# Patient Record
Sex: Female | Born: 2000 | Race: Black or African American | Hispanic: No | Marital: Single | Smoking: Never smoker
Health system: Southern US, Community
[De-identification: ages and names within clinical notes are randomized; demographics above are authoritative.]

## PROBLEM LIST (undated history)

## (undated) DIAGNOSIS — Z8619 Personal history of other infectious and parasitic diseases: Secondary | ICD-10-CM

## (undated) DIAGNOSIS — Z789 Other specified health status: Secondary | ICD-10-CM

## (undated) HISTORY — DX: Other specified health status: Z78.9

## (undated) HISTORY — DX: Personal history of other infectious and parasitic diseases: Z86.19

## (undated) HISTORY — PX: HERNIA REPAIR: SHX51

---
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
## 2018-01-17 ENCOUNTER — Emergency Department (HOSPITAL_COMMUNITY)
Admission: EM | Admit: 2018-01-17 | Discharge: 2018-01-17 | Disposition: A | Discharge status: Home / Self Care | Payer: Self-pay | Attending: Emergency Medicine | Admitting: Emergency Medicine

## 2018-01-17 ENCOUNTER — Other Ambulatory Visit: Payer: Self-pay

## 2018-01-17 ENCOUNTER — Emergency Department (HOSPITAL_COMMUNITY): Payer: Self-pay

## 2018-01-17 ENCOUNTER — Encounter (HOSPITAL_COMMUNITY): Payer: Self-pay

## 2018-01-17 DIAGNOSIS — R0789 Other chest pain: Secondary | ICD-10-CM | POA: Insufficient documentation

## 2018-01-17 LAB — BASIC METABOLIC PANEL
ANION GAP: 9 (ref 5–15)
BUN: 9 mg/dL (ref 6–20)
CHLORIDE: 106 mmol/L (ref 98–111)
CO2: 23 mmol/L (ref 22–32)
Calcium: 8.9 mg/dL (ref 8.9–10.3)
Creatinine, Ser: 0.79 mg/dL (ref 0.44–1.00)
GFR calc non Af Amer: >60 mL/min (ref >60)
GLUCOSE: 94 mg/dL (ref 70–99)
Potassium: 3.4 mmol/L — ABNORMAL LOW (ref 3.5–5.1)
Sodium: 138 mmol/L (ref 135–145)

## 2018-01-17 LAB — I-STAT TROPONIN, ED: Troponin i, poc: 0 ng/mL (ref 0.00–0.08)

## 2018-01-17 LAB — HEPATIC FUNCTION PANEL
ALK PHOS: 42 U/L (ref 38–126)
ALT: 11 U/L (ref 0–44)
AST: 15 U/L (ref 15–41)
Albumin: 3.7 g/dL (ref 3.5–5.0)
Bilirubin, Direct: <0.1 mg/dL (ref 0.0–0.2)
TOTAL PROTEIN: 7.2 g/dL (ref 6.5–8.1)
Total Bilirubin: 1 mg/dL (ref 0.3–1.2)

## 2018-01-17 LAB — CBC
HCT: 36.3 % (ref 36.0–46.0)
HEMOGLOBIN: 12.1 g/dL (ref 12.0–15.0)
MCH: 26.9 pg (ref 26.0–34.0)
MCHC: 33.3 g/dL (ref 30.0–36.0)
MCV: 80.7 fL (ref 78.0–100.0)
Platelets: 430 10*3/uL — ABNORMAL HIGH (ref 150–400)
RBC: 4.5 MIL/uL (ref 3.87–5.11)
RDW: 13 % (ref 11.5–15.5)
WBC: 7.6 10*3/uL (ref 4.0–10.5)

## 2018-01-17 LAB — I-STAT BETA HCG BLOOD, ED (MC, WL, AP ONLY): I-stat hCG, quantitative: <5 m[IU]/mL (ref <5)

## 2018-01-17 LAB — LIPASE, BLOOD: Lipase: 34 U/L (ref 11–51)

## 2018-01-17 MED ORDER — KETOROLAC TROMETHAMINE 30 MG/ML IJ SOLN
30.0000 mg | Freq: Once | INTRAMUSCULAR | Status: AC
  Administered 2018-01-17: 30 mg via INTRAMUSCULAR
  Filled 2018-01-17: qty 1

## 2018-01-17 MED ORDER — NAPROXEN 500 MG PO TABS: 500.0000 mg | ORAL_TABLET | Freq: Two times a day (BID) | ORAL | 0 refills | Status: DC

## 2018-01-17 MED ORDER — BENZONATATE 100 MG PO CAPS
200.0000 mg | ORAL_CAPSULE | Freq: Once | ORAL | Status: AC
  Administered 2018-01-17: 200 mg via ORAL
  Filled 2018-01-17: qty 2

## 2018-01-17 MED ORDER — BENZONATATE 100 MG PO CAPS: 200.0000 mg | ORAL_CAPSULE | Freq: Three times a day (TID) | ORAL | 0 refills | Status: DC

## 2018-01-17 NOTE — ED Provider Notes (Signed)
Patient placed in Quick Look pathway, seen and evaluated   Chief Complaint: Chest tightness productive cough  HPI:   Patient reports that she has had chest pain "for a while" and tightness.  She reports consistent cough for a month.  Feels like her throat has been closing for a month.    ROS: No fevers  Physical Exam:   Gen: No distress  Neuro: Awake and Alert  Skin: Warm    Focused Exam: Lungs CTAB.    Initiation of care has begun. The patient has been counseled on the process, plan, and necessity for staying for the completion/evaluation, and the remainder of the medical screening examination ]   Cristina GongHammond, Latonja Bobeck W, PA-C 01/17/18 1806    Charlynne PanderYao, David Hsienta, MD 01/17/18 [phone]

## 2018-01-17 NOTE — ED Provider Notes (Signed)
[facility] EMERGENCY DEPARTMENT Provider Note   CSN: [id number] Arrival date & time: 01/17/18  1746     History   Chief Complaint Chief Complaint  Patient presents with  . Chest Pain    HPI Dominique Travis is a 17 y.o. female who presents to ED for evaluation of chest pain that began last night. Also reports cough for the past month. Denies hemoptysis. States she spits/coughs up "mucus" every morning after waking up. No improvement with a few cough drops. Also reports some abdominal pain described as "just a tummyache, or maybe it's because I'm hungry." She takes ibuprofen for headaches but has not taken any particularly for the chest pain today. Has not tried any other medications to help with symptoms. Denies SOB, vomiting, bowel changes, fever, sick contacts, OCP use, recent surgeries, recent prolonged travel, prior MI, DVT, PE.  HPI  History reviewed. No pertinent past medical history.  There are no active problems to display for this patient.   Past Surgical History:  Procedure Laterality Date  . HERNIA REPAIR       OB History   None      Home Medications    Prior to Admission medications   Medication Sig Start Date End Date Taking? Authorizing Provider  acetaminophen (TYLENOL) 500 MG tablet Take 1 tablet (500 mg total) by mouth every 6 (six) hours as needed. Patient not taking: Reported on 09/24/2017 09/05/17   Fayrene Helper, PA-C  benzonatate (TESSALON) 100 MG capsule Take 2 capsules (200 mg total) by mouth every 8 (eight) hours. 01/17/18   Ayriana Wix, PA-C  dicyclomine (BENTYL) 20 MG tablet Take 1 tablet (20 mg total) by mouth every 8 (eight) hours as needed for spasms (Abdominal cramping). Patient not taking: Reported on 01/17/2018 09/24/17   Ward, Layla Maw, DO  loperamide (IMODIUM) 2 MG capsule Take 1 capsule (2 mg total) by mouth 4 (four) times daily as needed for diarrhea or loose stools. Patient not taking: Reported on 01/17/2018 09/24/17    Ward, Layla Maw, DO  naproxen (NAPROSYN) 500 MG tablet Take 1 tablet (500 mg total) by mouth 2 (two) times daily. 01/17/18   Ivelisse Culverhouse, PA-C  omeprazole (PRILOSEC) 40 MG capsule Take 1 capsule (40 mg total) by mouth daily. Patient not taking: Reported on 01/17/2018 09/24/17   Ward, Layla Maw, DO  promethazine (PHENERGAN) 25 MG tablet Take 1 tablet (25 mg total) by mouth every 6 (six) hours as needed for nausea or vomiting. Patient not taking: Reported on 01/17/2018 09/24/17   Ward, Layla Maw, DO    Family History History reviewed. No pertinent family history.  Social History Social History   Tobacco Use  . Smoking status: Never Smoker  . Smokeless tobacco: Never Used  Substance Use Topics  . Alcohol use: No    Frequency: Never  . Drug use: No     Allergies   Patient has no known allergies.   Review of Systems Review of Systems  Constitutional: Negative for appetite change, chills and fever.  HENT: Positive for congestion. Negative for ear pain, rhinorrhea, sneezing and sore throat.   Eyes: Negative for photophobia and visual disturbance.  Respiratory: Negative for cough, chest tightness, shortness of breath and wheezing.   Cardiovascular: Positive for chest pain. Negative for palpitations.  Gastrointestinal: Positive for abdominal pain. Negative for blood in stool, constipation, diarrhea, nausea and vomiting.  Genitourinary: Negative for dysuria, hematuria and urgency.  Musculoskeletal: Negative for myalgias.  Skin: Negative for rash.  Neurological: Negative for dizziness, weakness and light-headedness.     Physical Exam Updated Vital Signs BP 107/84 (BP Location: Right Arm)   Pulse 84   Temp 97.9 F (36.6 C) (Oral)   Resp 14   Ht 5\' 2"  (1.575 m)   Wt 54.9 kg (121 lb)   LMP 01/17/2018 (Exact Date)   SpO2 100%   BMI 22.13 kg/m   Physical Exam  Constitutional: She appears well-developed and well-nourished. No distress.  HENT:  Head: Normocephalic and  atraumatic.  Nose: Nose normal.  Patient does not appear to be in acute distress. No trismus or drooling present. No pooling of secretions. Patient is tolerating secretions and is not in respiratory distress. No neck pain or tenderness to palpation of the neck. Full active and passive range of motion of the neck. No evidence of RPA or PTA.  Eyes: Conjunctivae and EOM are normal. Right eye exhibits no discharge. Left eye exhibits no discharge. No scleral icterus.  Neck: Normal range of motion. Neck supple.  Cardiovascular: Normal rate, regular rhythm, normal heart sounds and intact distal pulses. Exam reveals no gallop and no friction rub.  No murmur heard. Pulmonary/Chest: Effort normal and breath sounds normal. No respiratory distress. She exhibits tenderness.    Abdominal: Soft. Bowel sounds are normal. She exhibits no distension. There is no tenderness. There is no guarding.  Musculoskeletal: Normal range of motion. She exhibits no edema.  No lower extremity edema, erythema or calf tenderness bilaterally.  Neurological: She is alert. She exhibits normal muscle tone. Coordination normal.  Skin: Skin is warm and dry. No rash noted.  Psychiatric: She has a normal mood and affect.  Nursing note and vitals reviewed.    ED Treatments / Results  Labs (all labs ordered are listed, but only abnormal results are displayed) Labs Reviewed  BASIC METABOLIC PANEL - Abnormal; Notable for the following components:      Result Value   Potassium 3.4 (*)    All other components within normal limits  CBC - Abnormal; Notable for the following components:   Platelets 430 (*)    All other components within normal limits  HEPATIC FUNCTION PANEL  LIPASE, BLOOD  I-STAT TROPONIN, ED  I-STAT BETA HCG BLOOD, ED ([facility], [facility], [facility] ONLY)    EKG EKG Interpretation  Date/Time:  Thursday January 17 2018 17:55:09 EDT Ventricular Rate:  97 PR Interval:  154 QRS Duration: 70 QT Interval:  348 QTC  Calculation: 441 R Axis:   93 Text Interpretation:  Normal sinus rhythm Rightward axis Nonspecific ST and T wave abnormality Abnormal ECG No old tracing to compare Confirmed by Jacalyn LefevreHaviland, Julie [id number]) on 01/17/2018 7:26:14 PM   Radiology Dg Chest 2 View  Result Date: 01/17/2018 CLINICAL DATA:  Dry cough with central chest pain for 1 month EXAM: CHEST - 2 VIEW COMPARISON:  09/05/2017 FINDINGS: Normal heart size and mediastinal contours. No acute infiltrate or edema. Nipple shadows. No effusion or pneumothorax. No acute osseous findings. IMPRESSION: Negative chest. Electronically Signed   By: Marnee SpringJonathon  Watts M.D.   On: 01/17/2018 19:45    Procedures Procedures (including critical care time)  Medications Ordered in ED Medications  benzonatate (TESSALON) capsule 200 mg (has no administration in time range)  ketorolac (TORADOL) 30 MG/ML injection 30 mg (30 mg Intramuscular Given 01/17/18 1944)     Initial Impression / Assessment and Plan / ED Course  I have reviewed the triage vital signs and the nursing notes.  Pertinent labs & imaging results  that were available during my care of the patient were reviewed by me and considered in my medical decision making (see chart for details).     17 year old female presents to ED for evaluation of chest pain that began last night.  She reports cough for the past month.  Denies hemoptysis.  States that she spit/coughs up mucus every morning.  Also reports some vague abdominal pain described as "a tummy ache."  Denies any shortness of breath, vomiting, bowel changes, fever, OCP use, recent surgeries, recent prolonged travel, leg swelling, history of MI, DVT or PE.  On physical exam she is overall well-appearing.  Chest pain is reproducible with palpation.  No lower extremity edema, erythema or calf tenderness bilaterally.  Normal work of breathing noted.  She is not tachycardic, tachypneic or hypoxic.  CBC, CMP, lipase, troponin unremarkable.  Chest x-ray is  negative.  EKG is nonischemic.  Patient is PERC negative and low risk by heart score.  She reports improvement in her symptoms with anti-inflammatories and antitussives given here.  Suspect that her symptoms are musculoskeletal in nature.  Advised to take anti-inflammatories, antitussives and to return to ED for any severe worsening symptoms.  Portions of this note were generated with Scientist, clinical (histocompatibility and immunogenetics). Dictation errors may occur despite best attempts at proofreading.   Final Clinical Impressions(s) / ED Diagnoses   Final diagnoses:  Chest wall pain    ED Discharge Orders        Ordered    naproxen (NAPROSYN) 500 MG tablet  2 times daily     01/17/18 2205    benzonatate (TESSALON) 100 MG capsule  Every 8 hours     01/17/18 2205       Dietrich Pates, PA-C 01/17/18 2210    Jacalyn Lefevre, MD 01/17/18 2227

## 2018-01-17 NOTE — Discharge Instructions (Signed)
Return to ED for any worsening symptoms including severe chest pain or trouble breathing, trouble swallowing, trouble moving her neck, lightheadedness or loss of consciousness.

## 2018-01-17 NOTE — ED Triage Notes (Signed)
Pt endorses Cough, chest pain, and feeling like her throat closing x 1 month. VSS

## 2018-01-17 NOTE — ED Notes (Signed)
Pt to xray

## 2018-02-11 ENCOUNTER — Emergency Department (HOSPITAL_COMMUNITY): Payer: Self-pay

## 2018-02-11 ENCOUNTER — Emergency Department (HOSPITAL_COMMUNITY)
Admission: EM | Admit: 2018-02-11 | Discharge: 2018-02-11 | Disposition: A | Discharge status: Home / Self Care | Payer: Self-pay | Attending: Emergency Medicine | Admitting: Emergency Medicine

## 2018-02-11 ENCOUNTER — Other Ambulatory Visit: Payer: Self-pay

## 2018-02-11 ENCOUNTER — Encounter (HOSPITAL_COMMUNITY): Payer: Self-pay

## 2018-02-11 ENCOUNTER — Encounter (HOSPITAL_COMMUNITY): Payer: Self-pay | Admitting: Emergency Medicine

## 2018-02-11 DIAGNOSIS — R0789 Other chest pain: Secondary | ICD-10-CM | POA: Insufficient documentation

## 2018-02-11 DIAGNOSIS — R0602 Shortness of breath: Secondary | ICD-10-CM | POA: Insufficient documentation

## 2018-02-11 DIAGNOSIS — Z5321 Procedure and treatment not carried out due to patient leaving prior to being seen by health care provider: Secondary | ICD-10-CM | POA: Insufficient documentation

## 2018-02-11 MED ORDER — NAPROXEN 500 MG PO TABS: 500.0000 mg | ORAL_TABLET | Freq: Two times a day (BID) | ORAL | 0 refills | Status: DC

## 2018-02-11 NOTE — ED Provider Notes (Signed)
[facility]-EMERGENCY DEPT Provider Note   CSN: [id number] Arrival date & time: 02/11/18  1858     History   Chief Complaint Chief Complaint  Patient presents with  . Shortness of Breath    HPI Dominique Travis is a 17 y.o. female with a past medical history significant for chest wall pain who presents for evaluation of SOB and chest pain that lasted for approximately 1 second. States she has had this previously and was seen in the ED. Reports an intermittent cough when she wakes up in the morning. Denies aggrivating or alleviating factors. Denies hemoptysis, nausea, vomiting, fever, sick contacts, OCP use, recent surgery or immobility, recent travel, prior DVT,PE or MI, leg swelling or pain. Denies family history of clotting disorders, sudden death. Denies history of hypertension, hypercholesterolemia, Diabetes. Denies history of asthma.  History reviewed. No pertinent past medical history.  There are no active problems to display for this patient.   Past Surgical History:  Procedure Laterality Date  . HERNIA REPAIR       OB History   None      Home Medications    Prior to Admission medications   Medication Sig Start Date End Date Taking? Authorizing Provider  ibuprofen (ADVIL,MOTRIN) 200 MG tablet Take 400 mg by mouth daily as needed for cramping.   Yes [provider]  acetaminophen (TYLENOL) 500 MG tablet Take 1 tablet (500 mg total) by mouth every 6 (six) hours as needed. Patient not taking: Reported on 02/11/2018 09/05/17   Fayrene Helper, PA-C  benzonatate (TESSALON) 100 MG capsule Take 2 capsules (200 mg total) by mouth every 8 (eight) hours. Patient not taking: Reported on 02/11/2018 01/17/18   Dietrich Pates, PA-C  dicyclomine (BENTYL) 20 MG tablet Take 1 tablet (20 mg total) by mouth every 8 (eight) hours as needed for spasms (Abdominal cramping). Patient not taking: Reported on 02/11/2018 09/24/17   Ward, Layla Maw, DO  loperamide (IMODIUM) 2 MG  capsule Take 1 capsule (2 mg total) by mouth 4 (four) times daily as needed for diarrhea or loose stools. Patient not taking: Reported on 01/17/2018 09/24/17   Ward, Layla Maw, DO  naproxen (NAPROSYN) 500 MG tablet Take 1 tablet (500 mg total) by mouth 2 (two) times daily. 02/11/18   Blake Goya A, PA-C  omeprazole (PRILOSEC) 40 MG capsule Take 1 capsule (40 mg total) by mouth daily. Patient not taking: Reported on 02/11/2018 09/24/17   Ward, Layla Maw, DO  promethazine (PHENERGAN) 25 MG tablet Take 1 tablet (25 mg total) by mouth every 6 (six) hours as needed for nausea or vomiting. Patient not taking: Reported on 02/11/2018 09/24/17   Ward, Layla Maw, DO    Family History History reviewed. No pertinent family history.  Social History Social History   Tobacco Use  . Smoking status: Never Smoker  . Smokeless tobacco: Never Used  Substance Use Topics  . Alcohol use: No    Frequency: Never  . Drug use: No     Allergies   Patient has no known allergies.   Review of Systems Review of Systems  Constitutional: Negative for chills and fever.  HENT: Negative for congestion, postnasal drip, rhinorrhea, sinus pressure and sinus pain.   Respiratory: Negative for apnea, cough, choking, chest tightness, shortness of breath, wheezing and stridor.        Episode of chest pain and SOB lasting approximately 1 second.  Cardiovascular: Positive for chest pain. Negative for palpitations and leg swelling.  Musculoskeletal:  Chest wall pain  Skin: Negative for pallor.  Neurological: Negative for dizziness, weakness and light-headedness.  All other systems reviewed and are negative.    Physical Exam Updated Vital Signs BP 119/86 (BP Location: Left Arm)   Pulse 81   Temp 98.2 F (36.8 C) (Oral)   Resp 18   Ht 5\' 2"  (1.575 m)   Wt 56.2 kg (124 lb)   LMP 01/16/2018   SpO2 98%   BMI 22.68 kg/m   Physical Exam  Constitutional: She is oriented to person, place, and time. She appears  well-developed and well-nourished.  Non-toxic appearance. She does not appear ill. No distress.  HENT:  Head: Normocephalic and atraumatic.  Mouth/Throat: Oropharynx is clear and moist.  Eyes: Pupils are equal, round, and reactive to light.  Neck: Normal range of motion. Neck supple.  Cardiovascular: Normal rate, regular rhythm, S1 normal, S2 normal, normal heart sounds, intact distal pulses and normal pulses. Exam reveals no gallop, no distant heart sounds and no friction rub.  No murmur heard. Pulmonary/Chest: Effort normal and breath sounds normal. No accessory muscle usage or stridor. No tachypnea. No respiratory distress. She has no wheezes. She has no rhonchi. She has no rales. Chest wall is not dull to percussion. She exhibits tenderness. She exhibits no bony tenderness, no crepitus, no edema, no deformity, no swelling and no retraction.    Abdominal: She exhibits no distension.  Musculoskeletal:       Right lower leg: Normal.       Left lower leg: Normal.  No lower extremity edema, erythema or calf tenderness  Neurological: She is alert and oriented to person, place, and time.  Skin: Skin is warm and dry.  Psychiatric: She has a normal mood and affect.  Nursing note and vitals reviewed.    ED Treatments / Results  Labs (all labs ordered are listed, but only abnormal results are displayed) Labs Reviewed - No data to display  EKG   Date: 02/11/2018  Rate: 82  Rhythm: normal sinus rhythm  QRS Axis: Borderline right axis deviation  Intervals: normal  ST/T Wave abnormalities: normal  Conduction Disutrbances: none  Narrative Interpretation: Normal EKG, no signs of ischemia.  Old EKG Reviewed: No significant changes noted    Radiology Dg Chest 2 View  Result Date: 02/11/2018 CLINICAL DATA:  Dyspnea EXAM: CHEST - 2 VIEW COMPARISON:  01/17/2018 FINDINGS: The heart size and mediastinal contours are within normal limits. Both lungs are clear. The visualized skeletal structures  are unremarkable. IMPRESSION: No active cardiopulmonary disease. Electronically Signed   By: Tollie Eth M.D.   On: 02/11/2018 20:08    Procedures Procedures (including critical care time)  Medications Ordered in ED Medications - No data to display   Initial Impression / Assessment and Plan / ED Course  I have reviewed the triage vital signs and the nursing notes as well as patients past medical history.  Pertinent labs & imaging results that were available during my care of the patient were reviewed by me and considered in my medical decision making (see chart for details).  17 year old female presents to the ED with chest pain and SOB lasting for approximatly 1 second. Has had a previous similar episode and was workup up in the Ed with normal troponin and d-dimer. PERC negative. No lower extremity edema, erythema or calf tenderness bilaterally. Chest x ray is negative. EKG is nonischemic. Given this pain is similar to her previous episode and her labs were negtive at  that time, I do not feel she needs a repeat troponin or d-dimer. Her intermittent pain is most likely musculoskeletal in nature as she has tenderness to palpation to the chest wall. Patient currently without chest pain or SOB. Will be prescribed short course of naproxen at d/c as she states this relieved her intermittent pain previously. I have discussed return precautions with patient and she is appropriate for discharge at this time. Discussed return precaution and she voiced understanding.     Final Clinical Impressions(s) / ED Diagnoses   Final diagnoses:  Chest wall pain    ED Discharge Orders        Ordered    naproxen (NAPROSYN) 500 MG tablet  2 times daily     02/11/18 2147       Elim Economou A, PA-C 02/11/18 2217    Terrilee FilesButler, Michael C, MD 02/12/18 [phone]

## 2018-02-11 NOTE — ED Triage Notes (Signed)
PT C/O A SUDDEN LOSS OF BREATH WHILE WATCHING TV THAT LASTED APPROX 1 SECOND. PT STS CP ON AND OFF "FOR A WHILE". PT IN NO APPARENT DISTRESS OR CP AT THIS TIME.

## 2018-02-11 NOTE — ED Notes (Signed)
Pt walked out of room and left facility. Pt did not speak to staff prior to leaving.

## 2018-02-11 NOTE — ED Triage Notes (Signed)
Patient states she was looking at tv and felt like she was not breathing. Patient states that she is not having any problems breathing right now.

## 2018-02-11 NOTE — Discharge Instructions (Addendum)
Your EKG and chest Xray were normal while in the Ed. Your chest pain is most likely musculoskeletal in nature. You have been prescribed and anti-inflammatory medication. Please take as prescribed.  Contact a health care provider if: You have a fever. Your chest pain becomes worse. You have new symptoms. Get help right away if: You have nausea or vomiting. You feel sweaty or light-headed. You have a cough with phlegm (sputum) or you cough up blood. You develop shortness of breath.

## 2018-05-22 ENCOUNTER — Emergency Department (HOSPITAL_COMMUNITY)
Admission: EM | Admit: 2018-05-22 | Discharge: 2018-05-22 | Disposition: A | Discharge status: Home / Self Care | Payer: Self-pay | Attending: Emergency Medicine | Admitting: Emergency Medicine

## 2018-05-22 ENCOUNTER — Other Ambulatory Visit: Payer: Self-pay

## 2018-05-22 ENCOUNTER — Encounter (HOSPITAL_COMMUNITY): Payer: Self-pay | Admitting: *Deleted

## 2018-05-22 DIAGNOSIS — R0981 Nasal congestion: Secondary | ICD-10-CM | POA: Insufficient documentation

## 2018-05-22 DIAGNOSIS — N943 Premenstrual tension syndrome: Secondary | ICD-10-CM

## 2018-05-22 DIAGNOSIS — N939 Abnormal uterine and vaginal bleeding, unspecified: Secondary | ICD-10-CM | POA: Insufficient documentation

## 2018-05-22 DIAGNOSIS — R091 Pleurisy: Secondary | ICD-10-CM | POA: Insufficient documentation

## 2018-05-22 DIAGNOSIS — R1030 Lower abdominal pain, unspecified: Secondary | ICD-10-CM | POA: Insufficient documentation

## 2018-05-22 LAB — WET PREP, GENITAL
Clue Cells Wet Prep HPF POC: NONE SEEN
Sperm: NONE SEEN
Trich, Wet Prep: NONE SEEN
Yeast Wet Prep HPF POC: NONE SEEN

## 2018-05-22 LAB — I-STAT BETA HCG BLOOD, ED (MC, WL, AP ONLY): I-stat hCG, quantitative: <5 m[IU]/mL (ref <5)

## 2018-05-22 MED ORDER — ALBUTEROL SULFATE HFA 108 (90 BASE) MCG/ACT IN AERS
2.0000 | INHALATION_SPRAY | RESPIRATORY_TRACT | Status: DC | PRN
  Filled 2018-05-22: qty 6.7

## 2018-05-22 NOTE — Discharge Instructions (Signed)
You can try using the inhaler to see if that helps with your chest symptoms.  It you take 2 puffs every 6 hours as needed.  You can also try to use saline spray for your nose.  If you are interested you could also try a medication like Zyrtec, cetirizine, Allegra, Claritin or something along those lines to see if it helps with the congestion in your nose.  There are also some essential oils that might help with congestion

## 2018-05-22 NOTE — ED Provider Notes (Signed)
[facility] EMERGENCY DEPARTMENT Provider Note   CSN: [id number] Arrival date & time: 05/22/18  1110     History   Chief Complaint Chief Complaint  Patient presents with  . Abdominal Pain    HPI Dominique Travis is a 17 y.o. female.  Patient is a 17 year old female with no significant past medical history presenting today with 1 week of intermittent nausea and some mild lower abdominal discomfort.  Symptoms are waxing and waning.  She denies any dysuria, frequency or urgency.  She does have vaginal discharge but feels like it is no different than her normal.  She has not had fever, cough, new congestion or shortness of breath.  She always suffers from aches and pains in her chest and arm area regularly but states that this is really unchanged from her baseline.  Patient has currently 3 sexual partners that she uses protection with most of the time but not every time.  She denies any alcohol, drug or tobacco use.  She used to take ibuprofen but states she quit taking it several months ago because she just did not want to have adverse side effects.  LMP was 3 weeks ago and patient does not use birth control.  The history is provided by the patient.    No past medical history on file.  There are no active problems to display for this patient.   Past Surgical History:  Procedure Laterality Date  . HERNIA REPAIR       OB History   None      Home Medications    Prior to Admission medications   Medication Sig Start Date End Date Taking? Authorizing Provider  acetaminophen (TYLENOL) 500 MG tablet Take 1 tablet (500 mg total) by mouth every 6 (six) hours as needed. Patient not taking: Reported on 02/11/2018 09/05/17   Fayrene Helper, PA-C  benzonatate (TESSALON) 100 MG capsule Take 2 capsules (200 mg total) by mouth every 8 (eight) hours. Patient not taking: Reported on 02/11/2018 01/17/18   Dietrich Pates, PA-C  dicyclomine (BENTYL) 20 MG tablet Take 1 tablet (20 mg  total) by mouth every 8 (eight) hours as needed for spasms (Abdominal cramping). Patient not taking: Reported on 02/11/2018 09/24/17   Ward, Layla Maw, DO  ibuprofen (ADVIL,MOTRIN) 200 MG tablet Take 400 mg by mouth daily as needed for cramping.    [provider]  loperamide (IMODIUM) 2 MG capsule Take 1 capsule (2 mg total) by mouth 4 (four) times daily as needed for diarrhea or loose stools. Patient not taking: Reported on 01/17/2018 09/24/17   Ward, Layla Maw, DO  naproxen (NAPROSYN) 500 MG tablet Take 1 tablet (500 mg total) by mouth 2 (two) times daily. 02/11/18   Henderly, Britni A, PA-C  omeprazole (PRILOSEC) 40 MG capsule Take 1 capsule (40 mg total) by mouth daily. Patient not taking: Reported on 02/11/2018 09/24/17   Ward, Layla Maw, DO  promethazine (PHENERGAN) 25 MG tablet Take 1 tablet (25 mg total) by mouth every 6 (six) hours as needed for nausea or vomiting. Patient not taking: Reported on 02/11/2018 09/24/17   Ward, Layla Maw, DO    Family History No family history on file.  Social History Social History   Tobacco Use  . Smoking status: Never Smoker  . Smokeless tobacco: Never Used  Substance Use Topics  . Alcohol use: No    Frequency: Never  . Drug use: No     Allergies   Patient has no known  allergies.   Review of Systems Review of Systems  All other systems reviewed and are negative.    Physical Exam Updated Vital Signs BP 117/87 (BP Location: Right Arm)   Pulse 95   Temp 98 F (36.7 C) (Oral)   Resp 16   SpO2 100%   Physical Exam  Constitutional: She is oriented to person, place, and time. She appears well-developed and well-nourished. No distress.  HENT:  Head: Normocephalic and atraumatic.  Nose: Mucosal edema and rhinorrhea present.  Eyes: Pupils are equal, round, and reactive to light. EOM are normal.  Fundoscopic exam:      The right eye shows no papilledema.       The left eye shows no papilledema.  Neck: Normal range of motion. Neck  supple.  Cardiovascular: Normal rate, regular rhythm, normal heart sounds and intact distal pulses. Exam reveals no friction rub.  No murmur heard. Pulmonary/Chest: Effort normal and breath sounds normal. She has no wheezes. She has no rales.  Abdominal: Soft. Bowel sounds are normal. She exhibits no distension. There is tenderness in the suprapubic area. There is no rebound, no guarding and no CVA tenderness.  Genitourinary: Uterus normal. Cervix exhibits no motion tenderness and no discharge. Right adnexum displays no tenderness. Left adnexum displays no tenderness. There is bleeding in the vagina. No vaginal discharge found.  Musculoskeletal: Normal range of motion. She exhibits no tenderness.  No edema  Lymphadenopathy:    She has no cervical adenopathy.  Neurological: She is alert and oriented to person, place, and time. She has normal strength. No cranial nerve deficit or sensory deficit. Gait normal.  photophobia  Skin: Skin is warm and dry. No rash noted.  Psychiatric: She has a normal mood and affect. Her behavior is normal.  Nursing note and vitals reviewed.    ED Treatments / Results  Labs (all labs ordered are listed, but only abnormal results are displayed) Labs Reviewed  WET PREP, GENITAL - Abnormal; Notable for the following components:      Result Value   WBC, Wet Prep HPF POC FEW (*)    All other components within normal limits  I-STAT BETA HCG BLOOD, ED ([facility], [facility], [facility] ONLY)  GC/CHLAMYDIA PROBE AMP ([place]) NOT AT [facility]    EKG None  Radiology No results found.  Procedures Procedures (including critical care time)  Medications Ordered in ED Medications  albuterol (PROVENTIL HFA;VENTOLIN HFA) 108 (90 Base) MCG/ACT inhaler 2 puff (has no administration in time range)     Initial Impression / Assessment and Plan / ED Course  I have reviewed the triage vital signs and the nursing notes.  Pertinent labs & imaging results that were available during my care  of the patient were reviewed by me and considered in my medical decision making (see chart for details).     Patient is a young healthy female presenting to the day with mild abdominal pain and intermittent nausea over the last 1 week.  She denies all other symptoms.  LMP was 3 weeks ago.  Patient is sexually active with multiple partners and does not use protection every time.  She is well-appearing on exam with some mild suprapubic discomfort.  Low suspicion for cholecystitis, hepatitis, gastritis, or appendicitis.  Concern for STI, pregnancy.  Lower suspicion for ovarian pathology.  Pelvic exam with minimal bleeding from the Os but o/w wnl.  Urine pregnancy test is negative and wet prep without concerning findings.  Low suspicion for STI today. When discussing  findings with the patient she states that splint of the reasons why she came but she more came because she has ongoing nasal congestion and her lungs feel like they are collapsing when she is leaning forward or laughing or coughing.  She does admit to being seen multiple times for this in the past and they always tell her everything is fine.  She has had multiple chest x-rays, blood work done in the last few months that have always been reassuring.  She does not describe any pain that is concerning for cardiac symptoms.  It is always with leaning forward or taking a deep breath or coughing.  This could be pleurisy musculoskeletal pain. PERC neg. patient has what seems like chronic congestion but she states she does not have allergies however it does seem to be better in the winter.  She is not interested in taking prescription medication for allergies or nasal sprays but will try saline spray.  Patient also would like to try an inhaler to see if that helps with the symptoms in her chest.  Final Clinical Impressions(s) / ED Diagnoses   Final diagnoses:  Nasal congestion  Pleurisy  Premenstrual symptom    ED Discharge Orders    None         Gwyneth Sprout, MD 05/22/18 1328

## 2018-05-22 NOTE — ED Triage Notes (Signed)
C/o left arm arm and side pain c/o nausea onset 1 week ago.

## 2018-07-10 NOTE — L&D Delivery Note (Signed)
Operative Delivery Note  Shortly after Amnioinfusion was started, FHR tracing worsened with deep prolonged variable decelerations. Variability still normal. Patient examined and found to be complete and crowning. Patient pushed over 4 contractions where FHR dropped to 60 bpm. Decision was made to assist delivery with outlet vacuum assistance. Patient was consented. NICU team was called to delivery. Baby was delivered easily within 1 contraction.   At 12:53 PM a viable female, named Dominique Travis, was delivered via Vaginal, Vacuum Neurosurgeon).  Presentation: vertex; Position: Right,, Occiput,, Transverse; Station: +3.  Verbal consent: obtained from patient.  Risks and benefits discussed in detail.  Risks include, but are not limited to the risks of anesthesia, bleeding, infection, damage to maternal tissues, fetal cephalhematoma.  There is also the risk of inability to effect vaginal delivery of the head, or shoulder dystocia that cannot be resolved by established maneuvers, leading to the need for emergency cesarean section.  APGAR: 9, 9; weight 2050 g Placenta status:complete 3 Vx cord sent to pathology     Anesthesia:  epidural Instruments: Kiwi Episiotomy: None Lacerations:  Right vulvar laceration Suture Repair: 4-0 Monocryl Quantitative  Blood Loss (mL):  212 cc  Mom to postpartum.  Baby to Couplet care / Skin to Skin.  Katharine Look A Nayra Coury 04/29/2019, 1:33 PM

## 2018-07-29 ENCOUNTER — Emergency Department (HOSPITAL_COMMUNITY)
Admission: EM | Admit: 2018-07-29 | Discharge: 2018-07-30 | Disposition: A | Discharge status: Home / Self Care | Payer: Self-pay | Attending: Emergency Medicine | Admitting: Emergency Medicine

## 2018-07-29 DIAGNOSIS — R109 Unspecified abdominal pain: Secondary | ICD-10-CM

## 2018-07-29 DIAGNOSIS — R1084 Generalized abdominal pain: Secondary | ICD-10-CM | POA: Insufficient documentation

## 2018-07-29 NOTE — ED Provider Notes (Signed)
  [facility] EMERGENCY DEPARTMENT Provider Note   CSN: [id number] Arrival date & time: 07/29/18  2315     History   Chief Complaint Chief complaint = cramping  HPI Dominique Travis is a 18 y.o. female.  The history is provided by the patient.  Abdominal Cramping  This is a new problem. The current episode started 12 to 24 hours ago. The problem has been gradually improving. Nothing aggravates the symptoms. Nothing relieves the symptoms.  Patient reports she has had mild nausea and abdominal cramping.  No vaginal bleeding.  No fevers or vomiting. She has never been pregnant before.  She is concerned she might be pregnant.   PMH-none Past Surgical History:  Procedure Laterality Date  . HERNIA REPAIR       OB History   No obstetric history on file.      Home Medications    Prior to Admission medications   Not on File    Family History No family history on file.  Social History Social History   Tobacco Use  . Smoking status: Never Smoker  . Smokeless tobacco: Never Used  Substance Use Topics  . Alcohol use: No    Frequency: Never  . Drug use: No     Allergies   Patient has no known allergies.   Review of Systems Review of Systems  Constitutional: Negative for fever.  Gastrointestinal: Positive for nausea. Negative for vomiting.  Genitourinary: Negative for dysuria and vaginal bleeding.  All other systems reviewed and are negative.    Physical Exam Updated Vital Signs There were no vitals taken for this visit.  Physical Exam CONSTITUTIONAL: Well developed/well nourished HEAD: Normocephalic/atraumatic EYES: EOMI ENMT: Mucous membranes moist NECK: supple no meningeal signs CV: S1/S2 noted, no murmurs/rubs/gallops noted LUNGS: Lungs are clear to auscultation bilaterally, no apparent distress ABDOMEN: soft, nontender, no rebound or guarding, bowel sounds noted throughout abdomen GU:no cva tenderness NEURO: Pt is  awake/alert/appropriate, moves all extremitiesx4.  No facial droop.   EXTREMITIES: pulses normal/equal, full ROM SKIN: warm, color normal PSYCH: no abnormalities of mood noted, alert and oriented to situation   ED Treatments / Results  Labs (all labs ordered are listed, but only abnormal results are displayed) Labs Reviewed  URINALYSIS, ROUTINE W REFLEX MICROSCOPIC - Abnormal; Notable for the following components:      Result Value   Specific Gravity, Urine 1.033 (*)    All other components within normal limits  POC URINE PREG, ED    EKG None  Radiology No results found.  Procedures Procedures   Medications Ordered in ED Medications - No data to display   Initial Impression / Assessment and Plan / ED Course  I have reviewed the triage vital signs and the nursing notes.  Pertinent labs  results that were available during my care of the patient were reviewed by me and considered in my medical decision making (see chart for details).     Patient thought she was pregnant.  Urine pregnancy test was negative.  No signs of UTI. Her abdominal exam is unremarkable. She is well-appearing.  My suspicion for acute abdominal or gynecologic emergency is low. Will discharge home Final Clinical Impressions(s) / ED Diagnoses   Final diagnoses:  Abdominal cramping    ED Discharge Orders    None       Zadie Rhine, MD 07/30/18 [id number]

## 2018-07-30 ENCOUNTER — Other Ambulatory Visit: Payer: Self-pay

## 2018-07-30 ENCOUNTER — Encounter (HOSPITAL_COMMUNITY): Payer: Self-pay | Admitting: Emergency Medicine

## 2018-07-30 LAB — URINALYSIS, ROUTINE W REFLEX MICROSCOPIC
Bilirubin Urine: NEGATIVE
GLUCOSE, UA: NEGATIVE mg/dL
HGB URINE DIPSTICK: NEGATIVE
KETONES UR: NEGATIVE mg/dL
LEUKOCYTES UA: NEGATIVE
Nitrite: NEGATIVE
PH: 5 (ref 5.0–8.0)
PROTEIN: NEGATIVE mg/dL
Specific Gravity, Urine: 1.033 — ABNORMAL HIGH (ref 1.005–1.030)

## 2018-07-30 LAB — POC URINE PREG, ED: PREG TEST UR: NEGATIVE

## 2018-07-30 NOTE — ED Notes (Signed)
Reviewed d/c instructions with pt, who verbalized understanding and had no outstanding questions. Pt departed in NAD, refused use of wheelchair.   

## 2018-07-30 NOTE — ED Triage Notes (Signed)
Pt states she wants a pregnancy test.

## 2018-07-30 NOTE — ED Notes (Signed)
ED Provider at bedside. 

## 2018-08-12 ENCOUNTER — Emergency Department (HOSPITAL_COMMUNITY)
Admission: EM | Admit: 2018-08-12 | Discharge: 2018-08-13 | Disposition: A | Discharge status: Home / Self Care | Payer: Self-pay | Attending: Emergency Medicine | Admitting: Emergency Medicine

## 2018-08-12 ENCOUNTER — Encounter (HOSPITAL_COMMUNITY): Payer: Self-pay | Admitting: *Deleted

## 2018-08-12 ENCOUNTER — Emergency Department (HOSPITAL_COMMUNITY): Payer: Self-pay

## 2018-08-12 ENCOUNTER — Other Ambulatory Visit: Payer: Self-pay

## 2018-08-12 DIAGNOSIS — R079 Chest pain, unspecified: Secondary | ICD-10-CM | POA: Insufficient documentation

## 2018-08-12 DIAGNOSIS — Z5321 Procedure and treatment not carried out due to patient leaving prior to being seen by health care provider: Secondary | ICD-10-CM | POA: Insufficient documentation

## 2018-08-12 LAB — BASIC METABOLIC PANEL
Anion gap: 9 (ref 5–15)
BUN: 17 mg/dL (ref 6–20)
CO2: 23 mmol/L (ref 22–32)
Calcium: 8.9 mg/dL (ref 8.9–10.3)
Chloride: 105 mmol/L (ref 98–111)
Creatinine, Ser: 0.76 mg/dL (ref 0.44–1.00)
GFR calc Af Amer: >60 mL/min (ref >60)
Glucose, Bld: 86 mg/dL (ref 70–99)
Potassium: 3.4 mmol/L — ABNORMAL LOW (ref 3.5–5.1)
SODIUM: 137 mmol/L (ref 135–145)

## 2018-08-12 LAB — CBC
HCT: 37 % (ref 36.0–46.0)
HEMOGLOBIN: 12.8 g/dL (ref 12.0–15.0)
MCH: 27.7 pg (ref 26.0–34.0)
MCHC: 34.6 g/dL (ref 30.0–36.0)
MCV: 80.1 fL (ref 80.0–100.0)
Platelets: 417 10*3/uL — ABNORMAL HIGH (ref 150–400)
RBC: 4.62 MIL/uL (ref 3.87–5.11)
RDW: 12.8 % (ref 11.5–15.5)
WBC: 10 10*3/uL (ref 4.0–10.5)
nRBC: 0 % (ref 0.0–0.2)

## 2018-08-12 MED ORDER — SODIUM CHLORIDE 0.9% FLUSH: 3.0000 mL | Freq: Once | INTRAVENOUS | Status: DC

## 2018-08-12 NOTE — ED Triage Notes (Signed)
Pt reports mid chest pressure for extended time but has become more frequent. Denies recent cough. When pain occurs, it radiates into her back. No acute distress is noted at triage.

## 2018-08-13 ENCOUNTER — Emergency Department (HOSPITAL_COMMUNITY): Admission: EM | Admit: 2018-08-13 | Discharge: 2018-08-13 | Discharge status: Against Medical Advice | Payer: Self-pay

## 2018-08-13 ENCOUNTER — Encounter (INDEPENDENT_AMBULATORY_CARE_PROVIDER_SITE_OTHER): Payer: Self-pay

## 2018-08-13 ENCOUNTER — Emergency Department (HOSPITAL_COMMUNITY): Payer: Self-pay

## 2018-08-13 LAB — I-STAT BETA HCG BLOOD, ED (MC, WL, AP ONLY): I-stat hCG, quantitative: <5 m[IU]/mL (ref <5)

## 2018-08-13 LAB — I-STAT TROPONIN, ED: TROPONIN I, POC: 0 ng/mL (ref 0.00–0.08)

## 2018-08-13 NOTE — ED Notes (Signed)
No answer for triage.

## 2018-08-13 NOTE — ED Notes (Signed)
No answer in waiting area for triage. 

## 2018-08-13 NOTE — ED Notes (Signed)
Pt states that she is going to come back tomorrow to be seen.

## 2018-08-14 ENCOUNTER — Emergency Department (HOSPITAL_COMMUNITY)
Admission: EM | Admit: 2018-08-14 | Discharge: 2018-08-14 | Disposition: A | Discharge status: Home / Self Care | Payer: Self-pay | Attending: Emergency Medicine | Admitting: Emergency Medicine

## 2018-08-14 ENCOUNTER — Encounter (HOSPITAL_COMMUNITY): Payer: Self-pay | Admitting: *Deleted

## 2018-08-14 ENCOUNTER — Other Ambulatory Visit: Payer: Self-pay

## 2018-08-14 DIAGNOSIS — R109 Unspecified abdominal pain: Secondary | ICD-10-CM | POA: Insufficient documentation

## 2018-08-14 DIAGNOSIS — R0789 Other chest pain: Secondary | ICD-10-CM | POA: Insufficient documentation

## 2018-08-14 DIAGNOSIS — R079 Chest pain, unspecified: Secondary | ICD-10-CM

## 2018-08-14 NOTE — ED Notes (Signed)
Patient was here today for test results done a couple days ago when seen in ED. She denies any new symptoms - RN reviewed MyChart and d/c instructions with patient, who verbalized understanding and denies further needs or questions at this time. VSS, patient ambulatory with steady gait.

## 2018-08-14 NOTE — ED Provider Notes (Signed)
[facility] Provider Note   CSN: [id number] Arrival date & time: 08/14/18  0154     History   Chief Complaint Chief Complaint  Patient presents with  . Abdominal Cramping    HPI Dominique KaufmannMakayla T Travis is a 18 y.o. female.  She was here 2 days ago with complaint of some chest pressure.  She also thought she might be pregnant because she felt her breasts were more sensitive and she was constipated.  She is also asking for a note for work.  She says she has chest pressure on and off for years and nobody is ever been able to figure out what it was.  She was here 2 days ago and they drew some lab work in triage but she said the wait was too long she ended up going home.  She had a home pregnancy test in mid January that was positive although she said 2- pregnancy tests in the ED.  She thinks she might be starting her period today.  She otherwise has no complaints today.  The history is provided by the patient.  Abdominal Cramping  This is a recurrent problem. The problem has been gradually improving. Associated symptoms include chest pain and abdominal pain. Pertinent negatives include no headaches and no shortness of breath. Nothing aggravates the symptoms. Nothing relieves the symptoms. She has tried nothing for the symptoms. The treatment provided no relief.    History reviewed. No pertinent past medical history.  There are no active problems to display for this patient.   Past Surgical History:  Procedure Laterality Date  . HERNIA REPAIR       OB History   No obstetric history on file.      Home Medications    Prior to Admission medications   Not on File    Family History No family history on file.  Social History Social History   Tobacco Use  . Smoking status: Never Smoker  . Smokeless tobacco: Never Used  Substance Use Topics  . Alcohol use: No    Frequency: Never  . Drug use: No     Allergies   Patient has no known  allergies.   Review of Systems Review of Systems  Constitutional: Negative for fever.  HENT: Negative for sore throat.   Eyes: Negative for visual disturbance.  Respiratory: Negative for shortness of breath.   Cardiovascular: Positive for chest pain.  Gastrointestinal: Positive for abdominal pain.  Genitourinary: Negative for dysuria.  Musculoskeletal: Negative for back pain.  Skin: Negative for rash.  Neurological: Negative for headaches.     Physical Exam Updated Vital Signs BP 106/78 (BP Location: Right Arm)   Pulse 90   Temp 97.9 F (36.6 C) (Oral)   Resp 17   LMP 07/12/2018   SpO2 98%   Physical Exam Vitals signs and nursing note reviewed.  Constitutional:      General: She is not in acute distress.    Appearance: She is well-developed.  HENT:     Head: Normocephalic and atraumatic.  Eyes:     Conjunctiva/sclera: Conjunctivae normal.  Neck:     Musculoskeletal: Neck supple.  Cardiovascular:     Rate and Rhythm: Normal rate and regular rhythm.     Heart sounds: No murmur.  Pulmonary:     Effort: Pulmonary effort is normal. No respiratory distress.     Breath sounds: Normal breath sounds.  Abdominal:     Palpations: Abdomen is soft.  Tenderness: There is no abdominal tenderness.  Musculoskeletal: Normal range of motion.        General: No tenderness or signs of injury.  Skin:    General: Skin is warm and dry.     Capillary Refill: Capillary refill takes less than 2 seconds.  Neurological:     General: No focal deficit present.     Mental Status: She is alert.      ED Treatments / Results  Labs (all labs ordered are listed, but only abnormal results are displayed) Labs Reviewed - No data to display  EKG None  Radiology No results found.  Procedures Procedures (including critical care time)  Medications Ordered in ED Medications - No data to display   Initial Impression / Assessment and Plan / ED Course  I have reviewed the triage  vital signs and the nursing notes.  Pertinent labs & imaging results that were available during my care of the patient were reviewed by me and considered in my medical decision making (see chart for details).  Clinical Course as of Aug 14 1822  Wed Aug 14, 2018  0725 Well-appearing female who that she presented here today stating she just wanted her test results from 2 days ago.  I reviewed her test results with her.  I asked if she had any complaints today that we should be evaluating and she said no.  She asked for a note for work.   [MB]    Clinical Course User Index [MB] Terrilee Files, MD    Final Clinical Impressions(s) / ED Diagnoses   Final diagnoses:  Nonspecific chest pain  Abdominal cramping    ED Discharge Orders    None       Terrilee Files, MD 08/14/18 [phone]

## 2018-08-14 NOTE — ED Triage Notes (Signed)
Pt was seen here 2 days ago for chest pain and is requesting her test results. Also reports feeling like she may have miscarried. LMP 07/12/2018; had a positive home pregnancy test, but negative preg test when she was last seen here. Reports large amounts of blood clots today and lower abd cramping

## 2018-08-14 NOTE — Discharge Instructions (Addendum)
You were seen in the emergency department to review test that were done a couple of days ago.  You had an EKG and blood work that did not show any obvious abnormalities.  Specifically there was no evidence of any heart injury and your pregnancy test was negative.  You should follow-up with your primary care doctor.  Please return if any worsening symptoms.

## 2018-09-11 ENCOUNTER — Encounter (HOSPITAL_COMMUNITY): Payer: Self-pay | Admitting: Emergency Medicine

## 2018-09-11 ENCOUNTER — Other Ambulatory Visit: Payer: Self-pay

## 2018-09-11 ENCOUNTER — Emergency Department (HOSPITAL_COMMUNITY)
Admission: EM | Admit: 2018-09-11 | Discharge: 2018-09-11 | Disposition: A | Discharge status: Home / Self Care | Payer: Self-pay | Attending: Emergency Medicine | Admitting: Emergency Medicine

## 2018-09-11 DIAGNOSIS — Z3201 Encounter for pregnancy test, result positive: Secondary | ICD-10-CM | POA: Insufficient documentation

## 2018-09-11 LAB — PREGNANCY, URINE: Preg Test, Ur: POSITIVE — AB

## 2018-09-11 NOTE — ED Triage Notes (Signed)
Pt. Stated, I need a pregnancy test.

## 2018-09-11 NOTE — ED Provider Notes (Signed)
[facility] EMERGENCY DEPARTMENT Provider Note   CSN: [id number] Arrival date & time: 09/11/18  1630    History   Chief Complaint Chief Complaint  Patient presents with  . Possible Pregnancy    HPI Dominique Travis is a 18 y.o. female.     Patient is a 18 year old female with no past medical history presents to the emergency department for "I need proof of pregnancy".  Patient is G0, reports that her last menstrual cycle was February 4.  Reports that she took a home pregnancy test and it was positive.  Reports that she needed proof of pregnancy in order to apply for Medicaid.  She reports she is having very mild stomach cramping.  No vaginal bleeding, no vaginal discharge, no dysuria, no nausea, vomiting.  Patient reports she is only here for proof of pregnancy and that is all.     History reviewed. No pertinent past medical history.  There are no active problems to display for this patient.   Past Surgical History:  Procedure Laterality Date  . HERNIA REPAIR       OB History   No obstetric history on file.      Home Medications    Prior to Admission medications   Not on File    Family History No family history on file.  Social History Social History   Tobacco Use  . Smoking status: Never Smoker  . Smokeless tobacco: Never Used  Substance Use Topics  . Alcohol use: No    Frequency: Never  . Drug use: No     Allergies   Patient has no known allergies.   Review of Systems Review of Systems  Constitutional: Negative for chills and fever.  HENT: Negative for ear pain and sore throat.   Eyes: Negative for pain and visual disturbance.  Respiratory: Negative for cough and shortness of breath.   Cardiovascular: Negative for chest pain and palpitations.  Gastrointestinal: Negative for abdominal pain and vomiting.  Genitourinary: Negative for decreased urine volume, dysuria, flank pain, frequency, hematuria, vaginal bleeding, vaginal  discharge and vaginal pain.  Musculoskeletal: Negative for arthralgias and back pain.  Skin: Negative for color change and rash.  Neurological: Negative for seizures and syncope.  All other systems reviewed and are negative.    Physical Exam Updated Vital Signs BP (!) 139/97 (BP Location: Right Arm)   Pulse (!) 123   Temp 97.6 F (36.4 C) (Oral)   Resp 16   Ht 5\' 2"  (1.575 m)   Wt 11.3 kg   LMP 08/13/2018   SpO2 97%   BMI 4.57 kg/m   Physical Exam Vitals signs and nursing note reviewed. Exam conducted with a chaperone present.  Constitutional:      Appearance: Normal appearance.  HENT:     Head: Normocephalic.     Mouth/Throat:     Pharynx: Oropharynx is clear.  Eyes:     Conjunctiva/sclera: Conjunctivae normal.  Pulmonary:     Effort: Pulmonary effort is normal.  Skin:    General: Skin is dry.  Neurological:     Mental Status: She is alert.  Psychiatric:        Mood and Affect: Mood normal.      ED Treatments / Results  Labs (all labs ordered are listed, but only abnormal results are displayed) Labs Reviewed  PREGNANCY, URINE - Abnormal; Notable for the following components:      Result Value   Preg Test, Ur POSITIVE (*)  All other components within normal limits    EKG None  Radiology No results found.  Procedures Procedures (including critical care time)  Medications Ordered in ED Medications - No data to display   Initial Impression / Assessment and Plan / ED Course  I have reviewed the triage vital signs and the nursing notes.  Pertinent labs & imaging results that were available during my care of the patient were reviewed by me and considered in my medical decision making (see chart for details).        Based on review of vitals, medical screening exam, lab work and/or imaging, there does not appear to be an acute, emergent etiology for the patient's symptoms. Counseled pt on good return precautions and encouraged both PCP and ED  follow-up as needed.  Prior to discharge, I also discussed incidental imaging findings with patient in detail and advised appropriate, recommended follow-up in detail.  Clinical Impression: 1. Positive pregnancy test     Disposition: Discharge    This note was prepared with assistance of Dragon voice recognition software. Occasional wrong-word or sound-a-like substitutions may have occurred due to the inherent limitations of voice recognition software.   Final Clinical Impressions(s) / ED Diagnoses   Final diagnoses:  Positive pregnancy test    ED Discharge Orders    None       Jeral Pinch 09/11/18 1811    Arby Barrette, MD 09/22/18 1409

## 2018-09-11 NOTE — ED Notes (Signed)
Declined W/C at D/C and was escorted to lobby by RN. 

## 2018-09-11 NOTE — Discharge Instructions (Addendum)
Your pregnancy test was positive. Please do not take any medication unless it is approved by your OB/GYN.  Follow-up with OB/GYN as soon as possible. Thank you for allowing me to care for you today. Please return to the emergency department if you have new or worsening symptoms. Take your medications as instructed.

## 2018-11-19 ENCOUNTER — Other Ambulatory Visit: Payer: Self-pay

## 2018-11-19 ENCOUNTER — Inpatient Hospital Stay (HOSPITAL_COMMUNITY)
Admission: AD | Admit: 2018-11-19 | Discharge: 2018-11-19 | Disposition: A | Discharge status: Home / Self Care | Payer: Medicaid Other | Attending: Obstetrics and Gynecology | Admitting: Obstetrics and Gynecology

## 2018-11-19 ENCOUNTER — Encounter (HOSPITAL_COMMUNITY): Payer: Self-pay

## 2018-11-19 DIAGNOSIS — Z3A14 14 weeks gestation of pregnancy: Secondary | ICD-10-CM | POA: Diagnosis not present

## 2018-11-19 DIAGNOSIS — R8290 Unspecified abnormal findings in urine: Secondary | ICD-10-CM

## 2018-11-19 DIAGNOSIS — R109 Unspecified abdominal pain: Secondary | ICD-10-CM

## 2018-11-19 DIAGNOSIS — O26892 Other specified pregnancy related conditions, second trimester: Secondary | ICD-10-CM | POA: Diagnosis not present

## 2018-11-19 DIAGNOSIS — B9689 Other specified bacterial agents as the cause of diseases classified elsewhere: Secondary | ICD-10-CM

## 2018-11-19 DIAGNOSIS — N760 Acute vaginitis: Secondary | ICD-10-CM

## 2018-11-19 DIAGNOSIS — O23592 Infection of other part of genital tract in pregnancy, second trimester: Secondary | ICD-10-CM | POA: Diagnosis not present

## 2018-11-19 LAB — URINALYSIS, ROUTINE W REFLEX MICROSCOPIC
Bilirubin Urine: NEGATIVE
Glucose, UA: NEGATIVE mg/dL
Hgb urine dipstick: NEGATIVE
Ketones, ur: 5 mg/dL — AB
Nitrite: NEGATIVE
Protein, ur: 30 mg/dL — AB
Specific Gravity, Urine: 1.029 (ref 1.005–1.030)
pH: 5 (ref 5.0–8.0)

## 2018-11-19 LAB — WET PREP, GENITAL
Sperm: NONE SEEN
Trich, Wet Prep: NONE SEEN
Yeast Wet Prep HPF POC: NONE SEEN

## 2018-11-19 MED ORDER — METRONIDAZOLE 500 MG PO TABS: 500.0000 mg | ORAL_TABLET | Freq: Two times a day (BID) | ORAL | 0 refills | Status: AC

## 2018-11-19 NOTE — MAU Note (Signed)
Unable to doppler FHT, Dr Earlene Plater notified, able to hear FHT with beside u/s.

## 2018-11-19 NOTE — MAU Note (Signed)
Dominique Travis is a 18 y.o. at [redacted]w[redacted]d here in MAU reporting: abdominal pain since last, reports the pain is on the right side. States she is not currently having the pain now. No vaginal bleeding, reports normal vaginal discharge.  Onset of complaint: last night  Pain score: 0/10  Vitals:   11/19/18 1637  BP: 114/77  Pulse: 100  Resp: 18  Temp: 98.5 F (36.9 C)  SpO2: 100%      Lab orders placed from triage: UA

## 2018-11-19 NOTE — Discharge Instructions (Signed)
Bacterial Vaginosis  Bacterial vaginosis is an infection of the vagina. It happens when too many normal germs (healthy bacteria) grow in the vagina. This infection puts you at risk for infections from sex (STIs). Treating this infection can lower your risk for some STIs. You should also treat this if you are pregnant. It can cause your baby to be born early. Follow these instructions at home: Medicines  Take over-the-counter and prescription medicines only as told by your doctor.  Take or use your antibiotic medicine as told by your doctor. Do not stop taking or using it even if you start to feel better. General instructions  If you your sexual partner is a woman, tell her that you have this infection. She needs to get treatment if she has symptoms. If you have a female partner, he does not need to be treated.  During treatment: ? Avoid sex. ? Do not douche. ? Avoid alcohol as told. ? Avoid breastfeeding as told.  Drink enough fluid to keep your pee (urine) clear or pale yellow.  Keep your vagina and butt (rectum) clean. ? Wash the area with warm water every day. ? Wipe from front to back after you use the toilet.  Keep all follow-up visits as told by your doctor. This is important. Preventing this condition  Do not douche.  Use only warm water to wash around your vagina.  Use protection when you have sex. This includes: ? Latex condoms. ? Dental dams.  Limit how many people you have sex with. It is best to only have sex with the same person (be monogamous).  Get tested for STIs. Have your partner get tested.  Wear underwear that is cotton or lined with cotton.  Avoid tight pants and pantyhose. This is most important in summer.  Do not use any products that have nicotine or tobacco in them. These include cigarettes and e-cigarettes. If you need help quitting, ask your doctor.  Do not use illegal drugs.  Limit how much alcohol you drink. Contact a doctor if:  Your  symptoms do not get better, even after you are treated.  You have more discharge or pain when you pee (urinate).  You have a fever.  You have pain in your belly (abdomen).  You have pain with sex.  Your bleed from your vagina between periods. Summary  This infection happens when too many germs (bacteria) grow in the vagina.  Treating this condition can lower your risk for some infections from sex (STIs).  You should also treat this if you are pregnant. It can cause early (premature) birth.  Do not stop taking or using your antibiotic medicine even if you start to feel better. This information is not intended to replace advice given to you by your health care provider. Make sure you discuss any questions you have with your health care provider. Document Released: 04/04/2008 Document Revised: 03/11/2016 Document Reviewed: 03/11/2016 Elsevier Interactive Patient Education  2019 Elsevier Inc.   Abdominal Pain During Pregnancy  Belly (abdominal) pain is common during pregnancy. There are many possible causes. Most of the time, it is not a serious problem. Other times, it can be a sign that something is wrong with the pregnancy. Always tell your doctor if you have belly pain. Follow these instructions at home:  Do not have sex or put anything in your vagina until your pain goes away completely.  Get plenty of rest until your pain gets better.  Drink enough fluid to keep your pee (  urine) pale yellow.  Take over-the-counter and prescription medicines only as told by your doctor.  Keep all follow-up visits as told by your doctor. This is important. Contact a doctor if:  Your pain continues or gets worse after resting.  You have lower belly pain that: ? Comes and goes at regular times. ? Spreads to your back. ? Feels like menstrual cramps.  You have pain or burning when you pee (urinate). Get help right away if:  You have a fever or chills.  You have vaginal  bleeding.  You are leaking fluid from your vagina.  You are passing tissue from your vagina.  You throw up (vomit) for more than 24 hours.  You have watery poop (diarrhea) for more than 24 hours.  Your baby is moving less than usual.  You feel very weak or faint.  You have shortness of breath.  You have very bad pain in your upper belly. Summary  Belly (abdominal) pain is common during pregnancy. There are many possible causes.  If you have belly pain during pregnancy, tell your doctor right away.  Keep all follow-up visits as told by your doctor. This is important. This information is not intended to replace advice given to you by your health care provider. Make sure you discuss any questions you have with your health care provider. Document Released: 06/14/2009 Document Revised: 09/28/2016 Document Reviewed: 09/28/2016 Elsevier Interactive Patient Education  2019 [employer]

## 2018-11-19 NOTE — MAU Provider Note (Signed)
History     CSN: [id number]  Arrival date and time: 11/19/18 1607   First Provider Initiated Contact with Patient 11/19/18 1645      Chief Complaint  Patient presents with  . Abdominal Pain   HPI   Dominique Travis is 18 y.o. G1P0 female at 6247w0d who presents for abdominal pain. Reports it as starting yesterday on the right side but is now more suprapubic and feels like pressure. Denies urinary changes. Denies vaginal bleeding. Endorses some asymptomatic vaginal discharge that has increased in amount. Reports she had an ultrasound last week at an outside facility that was normal and that baby's heartbeat was visualized.   OB History    Gravida  1   Para      Term      Preterm      AB      Living        SAB      TAB      Ectopic      Multiple      Live Births              History reviewed. No pertinent past medical history.  Past Surgical History:  Procedure Laterality Date  . HERNIA REPAIR      History reviewed. No pertinent family history.  Social History   Tobacco Use  . Smoking status: Never Smoker  . Smokeless tobacco: Never Used  Substance Use Topics  . Alcohol use: No    Frequency: Never  . Drug use: No    Allergies: No Known Allergies  No medications prior to admission.    Review of Systems  Constitutional: Negative for appetite change, chills and fever.  Respiratory: Negative for cough and shortness of breath.   Cardiovascular: Negative for chest pain.  Gastrointestinal: Negative for diarrhea, nausea and vomiting.  Genitourinary: Positive for vaginal discharge. Negative for difficulty urinating, dysuria, frequency, urgency and vaginal bleeding.  Musculoskeletal: Negative for back pain.  Skin: Negative for rash.   Physical Exam   Blood pressure 114/77, pulse 100, temperature 98.5 F (36.9 C), temperature source Oral, resp. rate 18, last menstrual period 08/13/2018, SpO2 100 %.  Physical Exam  Constitutional: She is oriented  to person, place, and time. She appears well-developed and well-nourished. No distress.  HENT:  Head: Normocephalic and atraumatic.  Eyes: Conjunctivae and EOM are normal.  Neck: Normal range of motion. Neck supple.  Cardiovascular: Normal rate and regular rhythm.  Respiratory: Breath sounds normal. No respiratory distress.  GI: Soft. She exhibits no distension. There is abdominal tenderness. There is no rebound and no guarding.  Mild suprapubic tenderness to palpation.   Genitourinary:    Vaginal discharge present.     Genitourinary Comments: No CMT or adnexal tenderness. Cervix closed.    Musculoskeletal: Normal range of motion.  Neurological: She is alert and oriented to person, place, and time.  Skin: Skin is warm and dry. She is not diaphoretic.  Psychiatric: She has a normal mood and affect. Her behavior is normal.   Unable to obtain FHT with doppler. Bedside ultrasound performed that visualized fetal movement and fetal heartbeat. Doppler on ultrasound yielded FHT in 150s.   MAU Course  Procedures  MDM Wet prep, UA, GC/Chlamydia obtained. Urine culture sent.   Assessment and Plan   1. Abdominal pain during pregnancy in second trimester   2. [redacted] weeks gestation of pregnancy   3. Bacterial vaginosis   4. Abnormal urinalysis    Patient well appearing  with stable vital signs. Suspect benign etiology of her abdominal pain. FHR is normal. Wet prep +clue cells. Will treat for BV with Flagyl. UA appears mildly abnormal with large leukocytes and 21-50 WBCs although squamous cells may indicate dirty catch. Given suprapubic pain with abnormal UA, will send for OB culture. Ketones and protein also mildly elevated. Advised patient to hydrate well. Discussed strict return precautions.    De Hollingshead 11/19/2018, 5:20 PM

## 2018-11-20 LAB — GC/CHLAMYDIA PROBE AMP (~~LOC~~) NOT AT ARMC
Chlamydia: POSITIVE — AB
Neisseria Gonorrhea: NEGATIVE

## 2018-11-21 LAB — CULTURE, OB URINE: Culture: NO GROWTH

## 2018-11-28 ENCOUNTER — Other Ambulatory Visit: Payer: Self-pay | Admitting: Obstetrics and Gynecology

## 2018-11-28 ENCOUNTER — Telehealth: Payer: Self-pay | Admitting: Obstetrics and Gynecology

## 2018-11-28 MED ORDER — AZITHROMYCIN 500 MG PO TABS: 1000.0000 mg | ORAL_TABLET | Freq: Once | ORAL | 0 refills | Status: AC

## 2018-11-28 NOTE — Progress Notes (Signed)
Azithromycin sent to pharmacy for positive chlamydia.

## 2018-11-28 NOTE — Telephone Encounter (Signed)
Called the patient to confirm upcoming appointment. Received a message, we’re sorry you call can not be completed at this time. °

## 2018-11-29 ENCOUNTER — Inpatient Hospital Stay (HOSPITAL_COMMUNITY)
Admission: AD | Admit: 2018-11-29 | Discharge: 2018-11-29 | Disposition: A | Discharge status: Home / Self Care | Payer: Medicaid Other | Attending: Obstetrics & Gynecology | Admitting: Obstetrics & Gynecology

## 2018-11-29 ENCOUNTER — Other Ambulatory Visit: Payer: Self-pay

## 2018-11-29 DIAGNOSIS — O23592 Infection of other part of genital tract in pregnancy, second trimester: Secondary | ICD-10-CM

## 2018-11-29 DIAGNOSIS — O98812 Other maternal infectious and parasitic diseases complicating pregnancy, second trimester: Secondary | ICD-10-CM | POA: Insufficient documentation

## 2018-11-29 DIAGNOSIS — R102 Pelvic and perineal pain: Secondary | ICD-10-CM | POA: Diagnosis present

## 2018-11-29 DIAGNOSIS — Z3492 Encounter for supervision of normal pregnancy, unspecified, second trimester: Secondary | ICD-10-CM

## 2018-11-29 DIAGNOSIS — Z3480 Encounter for supervision of other normal pregnancy, unspecified trimester: Secondary | ICD-10-CM

## 2018-11-29 DIAGNOSIS — Z3A15 15 weeks gestation of pregnancy: Secondary | ICD-10-CM | POA: Diagnosis not present

## 2018-11-29 DIAGNOSIS — A749 Chlamydial infection, unspecified: Secondary | ICD-10-CM

## 2018-11-29 MED ORDER — AZITHROMYCIN 250 MG PO TABS
1000.0000 mg | ORAL_TABLET | Freq: Once | ORAL | Status: AC
  Administered 2018-11-29: 20:00:00 1000 mg via ORAL
  Filled 2018-11-29: qty 4

## 2018-11-29 NOTE — Discharge Instructions (Signed)
Chlamydia, Female    Chlamydia is a STD (sexually transmitted disease). This is an infection that spreads through sexual contact. If it is not treated, it can cause serious problems. It must be treated with antibiotic medicine.  If this infection is not treated and you are pregnant or become pregnant, your baby could get it during delivery. This may cause bad health problems for the baby.  Sometimes, you may not have symptoms (asymptomatic). When you have symptoms, they can include:   Burning when you pee (urinate).   Peeing often.   Fluid (discharge) coming from the vagina.   Redness, soreness, and swelling (inflammation) of the butt (rectum).   Bleeding or fluid coming from the butt.   Belly (abdominal) pain.   Pain during sex.   Bleeding between periods.   Itching, burning, or redness in the eyes.   Fluid coming from the eyes.  Follow these instructions at home:  Medicines   Take over-the-counter and prescription medicines only as told by your doctor.   Take your antibiotic medicine as told by your doctor. Do not stop taking the antibiotic even if you start to feel better.  Sexual activity   Tell sex partners about your infection. Sex partners are people you had oral, anal, or vaginal sex with within 60 days of when you started getting sick. They need treatment, too.   Do not have sex until:  ? You and your sex partners have been treated.  ? Your doctor says it is okay.   If you have a single dose treatment, wait 7 days before having sex.  General instructions   It is up to you to get your test results. Ask your doctor when your results will be ready.   Get a lot of rest.   Eat healthy foods.   Drink enough fluid to keep your pee (urine) clear or pale yellow.   Keep all follow-up visits as told by your doctor. You may need tests after 3 months.  Preventing chlamydia   The only way to prevent chlamydia is not to have sex. To lower your risk:  ? Use latex condoms correctly. Do this every time  you have sex.  ? Avoid having many sex partners.  ? Ask if your partner has been tested for STDs and if he or she had negative results.  Contact a doctor if:   You get new symptoms.   You do not get better with treatment.   You have a fever or chills.   You have pain during sex.  Get help right away if:   Your pain gets worse and does not get better with medicine.   You get flu-like symptoms, such as:  ? Night sweats.  ? Sore throat.  ? Muscle aches.   You feel sick to your stomach (nauseous).   You throw up (vomit).   You have trouble swallowing.   You have bleeding:  ? Between periods.  ? After sex.   You have irregular periods.   You have belly pain that does not get better with medicine.   You have lower back pain that does not get better with medicine.   You feel weak or dizzy.   You pass out (faint).   You are pregnant and you get symptoms of chlamydia.  Summary   Chlamydia is an infection that spreads through sexual contact.   Sometimes, chlamydia can cause no symptoms (asymptomatic).   Do not have sex until your doctor says it   is okay.   All sex partners will have to be treated for chlamydia.  This information is not intended to replace advice given to you by your health care provider. Make sure you discuss any questions you have with your health care provider.  Document Released: 04/04/2008 Document Revised: 12/18/2017 Document Reviewed: 06/15/2016  Elsevier Interactive Patient Education  2019 [employer]vier Inc.

## 2018-11-29 NOTE — MAU Provider Note (Signed)
History     CSN: [id number]  Arrival date and time: 11/29/18 [id number]   First Provider Initiated Contact with Patient 11/29/18 1908      Chief Complaint  Patient presents with  . pelvic pressure   HPI Dominique Travis is a 18 y.o. G1P0 at 6371w3d who presents to MAU with chief complaint of suprapubic pressure and dysuria for the past three days. Patient denies abnormal vaginal discharge, vaginal bleeding, abdominal pain, fever or recent illness. She has not initiated prenatal care but states she "think I just need to get my cervix checked".  Patient's history is significant for recent Chlamydia diagnosis. Azithromycin was called to her pharmacy yesterday but she has not initiated the prescription "because I don't stay near that pharmacy anymore". She is unsure when she will be able to start her prescription.  Patient states [facility] and she declines expedited partner treatment.  OB History    Gravida  1   Para      Term      Preterm      AB      Living        SAB      TAB      Ectopic      Multiple      Live Births              No past medical history on file.  Past Surgical History:  Procedure Laterality Date  . HERNIA REPAIR      No family history on file.  Social History   Tobacco Use  . Smoking status: Never Smoker  . Smokeless tobacco: Never Used  Substance Use Topics  . Alcohol use: No    Frequency: Never  . Drug use: No    Allergies: No Known Allergies  No medications prior to admission.    Review of Systems  Constitutional: Negative for chills, fatigue and fever.  Gastrointestinal: Negative for abdominal pain.  Genitourinary: Positive for dysuria and pelvic pain. Negative for difficulty urinating, dyspareunia and flank pain.  Musculoskeletal: Negative for back pain.  All other systems reviewed and are negative.  Physical Exam   Blood pressure 110/65, pulse (!) 106, temperature 98.2 F (36.8 C),  temperature source Oral, resp. rate 16, height 5' 2.5" (1.588 m), weight 54.2 kg, last menstrual period 08/13/2018, SpO2 100 %.  Physical Exam  Nursing note and vitals reviewed. Constitutional: She is oriented to person, place, and time. She appears well-developed and well-nourished.  Respiratory: Effort normal. No respiratory distress.  GI: There is no abdominal tenderness. There is no CVA tenderness.  Neurological: She is oriented to person, place, and time.  Skin: Skin is warm and dry.  Psychiatric: She has a normal mood and affect. Her behavior is normal. Judgment and thought content normal.    MAU Course/MDM  Procedures  --Treat for Chlamydia based on positive swab and patient report of lack of treatment --Patient declined repeat UA  Patient Vitals for the past 24 hrs:  BP Temp Temp src Pulse Resp SpO2 Height Weight  11/29/18 1901 110/65 98.2 F (36.8 C) Oral (!) 106 16 100 % 5' 2.5" (1.588 m) 54.2 kg     Assessment and Plan  --18 y.o. G1P0 at 1471w3d  --FHT 154 by Doppler --Chlamydia POSITIVE, treated in MAU --Partner treatment declined by patient --Discharge home in stable condition  F/U: [facility]  Calvert CantorSamantha C Riely Baskett, CNM 11/29/2018,  8:25 PM

## 2018-11-29 NOTE — Progress Notes (Signed)
Written and verbal d/c instructions given and understanding voiced. 

## 2018-11-29 NOTE — MAU Note (Addendum)
Just needs to get checked. States has an appointment on 6/10, but was told she needed to get seen.  She is feeling pressure, got really bad last night, been going on for about 3 days, on and off.  No bleeding.  Pressure when she urinates

## 2018-12-03 ENCOUNTER — Telehealth: Payer: Self-pay | Admitting: Student

## 2018-12-03 ENCOUNTER — Ambulatory Visit: Payer: Self-pay | Admitting: *Deleted

## 2018-12-03 ENCOUNTER — Encounter: Payer: Self-pay | Admitting: Student

## 2018-12-03 ENCOUNTER — Other Ambulatory Visit: Payer: Self-pay

## 2018-12-03 NOTE — Telephone Encounter (Signed)
Called the patient to reschedule the appointment. Received a message you number can not be completed as dialed. Sending an appointment reminder for the rescheduled appointment per nurse Diane and also sending a missed appointment letter.

## 2018-12-03 NOTE — Progress Notes (Signed)
Called pt @ [phone] to begin Ob intake interview as scheduled. She did not answer and outgoing message stated that the call could not be completed at this time.  [id number]  Pt was called a second time and again did not answer. Same outgoing message was heard and I was not able to leave a VM message for pt. Her appt will be rescheduled and she will be contacted with this information.

## 2018-12-04 ENCOUNTER — Telehealth: Payer: Self-pay | Admitting: Family Medicine

## 2018-12-04 NOTE — Telephone Encounter (Signed)
Attempted to call patient several times to inform her of a change made in the office. Called both numbers in Epic, and no one answered either number.

## 2018-12-05 ENCOUNTER — Telehealth: Payer: Medicaid Other | Admitting: *Deleted

## 2018-12-05 ENCOUNTER — Other Ambulatory Visit: Payer: Self-pay

## 2018-12-05 NOTE — Progress Notes (Signed)
1:02 I called Dabney for her virtual visit and was unable to leave a message- I heard a message call can not be completed at this time - try your call later. Gwyndolyn Guilford,RN 1:15  I called Malisa for her virtual visit and was unable to leave a message- I heard a message call can not be completed at this time - try your call later. Anaaya Fuster,RN 1:30 I called Allisha again and heard same message. I called her contact number and he said she is there with him. I asked to speak with her. Olie states she has set up appointment with another ob provider ([facility])and will not be coming to our office. I asked if she wanted [place] to cancel her new ob appt and she verified she did.  Glorimar Stroope,RN

## 2018-12-20 ENCOUNTER — Telehealth: Payer: Self-pay | Admitting: Obstetrics & Gynecology

## 2018-12-20 NOTE — Telephone Encounter (Signed)
Attempted to call patient to ask her about any symptoms, and to wear her mask the whole visit covering her nose and mouth. Also, to sanitize her hands upon arriving. Not a working number.

## 2018-12-23 ENCOUNTER — Telehealth: Payer: Self-pay | Admitting: Licensed Clinical Social Worker

## 2018-12-23 ENCOUNTER — Encounter: Payer: Medicaid Other | Admitting: Obstetrics & Gynecology

## 2018-12-23 LAB — OB RESULTS CONSOLE RUBELLA ANTIBODY, IGM: Rubella: IMMUNE

## 2018-12-23 LAB — OB RESULTS CONSOLE HIV ANTIBODY (ROUTINE TESTING): HIV: NONREACTIVE

## 2018-12-23 LAB — OB RESULTS CONSOLE HEPATITIS B SURFACE ANTIGEN: Hepatitis B Surface Ag: NEGATIVE

## 2018-12-23 NOTE — Telephone Encounter (Signed)
Called phone number listed for patient regarding missed appointment unable to leave message due to busy signal

## 2018-12-24 ENCOUNTER — Encounter: Payer: Self-pay | Admitting: Obstetrics & Gynecology

## 2019-01-02 ENCOUNTER — Other Ambulatory Visit (HOSPITAL_COMMUNITY): Payer: Self-pay | Admitting: Obstetrics & Gynecology

## 2019-01-02 DIAGNOSIS — O26872 Cervical shortening, second trimester: Secondary | ICD-10-CM

## 2019-01-02 DIAGNOSIS — Z3A21 21 weeks gestation of pregnancy: Secondary | ICD-10-CM

## 2019-01-08 ENCOUNTER — Ambulatory Visit (HOSPITAL_COMMUNITY): Payer: Medicaid Other | Admitting: *Deleted

## 2019-01-08 ENCOUNTER — Ambulatory Visit (HOSPITAL_COMMUNITY)
Admission: RE | Admit: 2019-01-08 | Discharge: 2019-01-08 | Disposition: A | Discharge status: Home / Self Care | Payer: Medicaid Other | Source: Ambulatory Visit | Attending: Obstetrics & Gynecology | Admitting: Obstetrics & Gynecology

## 2019-01-08 ENCOUNTER — Encounter (HOSPITAL_COMMUNITY): Payer: Self-pay

## 2019-01-08 ENCOUNTER — Other Ambulatory Visit: Payer: Self-pay

## 2019-01-08 DIAGNOSIS — Z3A21 21 weeks gestation of pregnancy: Secondary | ICD-10-CM | POA: Insufficient documentation

## 2019-01-08 DIAGNOSIS — A749 Chlamydial infection, unspecified: Secondary | ICD-10-CM | POA: Diagnosis present

## 2019-01-08 DIAGNOSIS — Z3686 Encounter for antenatal screening for cervical length: Secondary | ICD-10-CM

## 2019-01-08 DIAGNOSIS — O26872 Cervical shortening, second trimester: Secondary | ICD-10-CM | POA: Diagnosis not present

## 2019-04-16 ENCOUNTER — Encounter (HOSPITAL_COMMUNITY): Payer: Self-pay | Admitting: *Deleted

## 2019-04-16 ENCOUNTER — Telehealth (HOSPITAL_COMMUNITY): Payer: Self-pay | Admitting: *Deleted

## 2019-04-16 NOTE — Telephone Encounter (Signed)
Preadmission screen  

## 2019-04-21 ENCOUNTER — Telehealth (HOSPITAL_COMMUNITY): Payer: Self-pay | Admitting: *Deleted

## 2019-04-21 LAB — OB RESULTS CONSOLE GBS: GBS: NEGATIVE

## 2019-04-21 NOTE — Telephone Encounter (Signed)
Preadmission screen  

## 2019-04-22 ENCOUNTER — Encounter (HOSPITAL_COMMUNITY): Payer: Self-pay | Admitting: *Deleted

## 2019-04-22 ENCOUNTER — Telehealth (HOSPITAL_COMMUNITY): Payer: Self-pay | Admitting: *Deleted

## 2019-04-22 ENCOUNTER — Other Ambulatory Visit: Payer: Self-pay | Admitting: Obstetrics & Gynecology

## 2019-04-22 NOTE — Telephone Encounter (Signed)
Preadmission screen  

## 2019-04-27 ENCOUNTER — Other Ambulatory Visit: Payer: Self-pay

## 2019-04-27 ENCOUNTER — Other Ambulatory Visit (HOSPITAL_COMMUNITY)
Admission: RE | Admit: 2019-04-27 | Discharge: 2019-04-27 | Disposition: A | Discharge status: Home / Self Care | Payer: Medicaid Other | Source: Ambulatory Visit | Attending: Obstetrics & Gynecology | Admitting: Obstetrics & Gynecology

## 2019-04-27 DIAGNOSIS — Z01812 Encounter for preprocedural laboratory examination: Secondary | ICD-10-CM | POA: Insufficient documentation

## 2019-04-27 DIAGNOSIS — Z20828 Contact with and (suspected) exposure to other viral communicable diseases: Secondary | ICD-10-CM | POA: Insufficient documentation

## 2019-04-27 LAB — SARS CORONAVIRUS 2 BY RT PCR (HOSPITAL ORDER, PERFORMED IN ~~LOC~~ HOSPITAL LAB): SARS Coronavirus 2: NEGATIVE

## 2019-04-27 NOTE — MAU Note (Signed)
Pt here for PAT covid swab. Denies symptoms. Swab collected.  

## 2019-04-29 ENCOUNTER — Inpatient Hospital Stay (HOSPITAL_COMMUNITY): Payer: Medicaid Other | Admitting: Anesthesiology

## 2019-04-29 ENCOUNTER — Inpatient Hospital Stay (HOSPITAL_COMMUNITY): Payer: Medicaid Other

## 2019-04-29 ENCOUNTER — Other Ambulatory Visit: Payer: Self-pay

## 2019-04-29 ENCOUNTER — Encounter (HOSPITAL_COMMUNITY): Payer: Self-pay

## 2019-04-29 ENCOUNTER — Inpatient Hospital Stay (HOSPITAL_COMMUNITY)
Admission: AD | Admit: 2019-04-29 | Discharge: 2019-05-01 | DRG: 807 | Disposition: A | Discharge status: Home / Self Care | Payer: Medicaid Other | Attending: Obstetrics & Gynecology | Admitting: Obstetrics & Gynecology

## 2019-04-29 DIAGNOSIS — Z3480 Encounter for supervision of other normal pregnancy, unspecified trimester: Secondary | ICD-10-CM

## 2019-04-29 DIAGNOSIS — Z3A37 37 weeks gestation of pregnancy: Secondary | ICD-10-CM

## 2019-04-29 DIAGNOSIS — O365930 Maternal care for other known or suspected poor fetal growth, third trimester, not applicable or unspecified: Secondary | ICD-10-CM | POA: Diagnosis present

## 2019-04-29 LAB — TYPE AND SCREEN
ABO/RH(D): B POS
Antibody Screen: NEGATIVE

## 2019-04-29 LAB — CBC
HCT: 32.2 % — ABNORMAL LOW (ref 36.0–46.0)
Hemoglobin: 11.1 g/dL — ABNORMAL LOW (ref 12.0–15.0)
MCH: 26.7 pg (ref 26.0–34.0)
MCHC: 34.5 g/dL (ref 30.0–36.0)
MCV: 77.6 fL — ABNORMAL LOW (ref 80.0–100.0)
Platelets: 332 10*3/uL (ref 150–400)
RBC: 4.15 MIL/uL (ref 3.87–5.11)
RDW: 13.8 % (ref 11.5–15.5)
WBC: 10.8 10*3/uL — ABNORMAL HIGH (ref 4.0–10.5)
nRBC: 0 % (ref 0.0–0.2)

## 2019-04-29 LAB — RPR: RPR Ser Ql: NONREACTIVE

## 2019-04-29 MED ORDER — LACTATED RINGERS AMNIOINFUSION
INTRAVENOUS | Status: DC
  Administered 2019-04-29: 12:00:00 via INTRAUTERINE

## 2019-04-29 MED ORDER — FENTANYL CITRATE (PF) 100 MCG/2ML IJ SOLN
50.0000 ug | INTRAMUSCULAR | Status: DC | PRN
  Administered 2019-04-29: 100 ug via INTRAVENOUS
  Filled 2019-04-29: qty 2

## 2019-04-29 MED ORDER — TETANUS-DIPHTH-ACELL PERTUSSIS 5-2.5-18.5 LF-MCG/0.5 IM SUSP: 0.5000 mL | Freq: Once | INTRAMUSCULAR | Status: DC

## 2019-04-29 MED ORDER — PRENATAL MULTIVITAMIN CH
1.0000 | ORAL_TABLET | Freq: Every day | ORAL | Status: DC
  Administered 2019-04-30: 1 via ORAL
  Filled 2019-04-29: qty 1

## 2019-04-29 MED ORDER — ONDANSETRON HCL 4 MG/2ML IJ SOLN: 4.0000 mg | INTRAMUSCULAR | Status: DC | PRN

## 2019-04-29 MED ORDER — EPHEDRINE 5 MG/ML INJ: 10.0000 mg | INTRAVENOUS | Status: DC | PRN

## 2019-04-29 MED ORDER — TERBUTALINE SULFATE 1 MG/ML IJ SOLN
0.2500 mg | Freq: Once | INTRAMUSCULAR | Status: AC | PRN
  Administered 2019-04-29: 0.25 mg via SUBCUTANEOUS
  Filled 2019-04-29: qty 1

## 2019-04-29 MED ORDER — DIPHENHYDRAMINE HCL 25 MG PO CAPS: 25.0000 mg | ORAL_CAPSULE | Freq: Four times a day (QID) | ORAL | Status: DC | PRN

## 2019-04-29 MED ORDER — COCONUT OIL OIL: 1.0000 "application " | TOPICAL_OIL | Status: DC | PRN

## 2019-04-29 MED ORDER — SOD CITRATE-CITRIC ACID 500-334 MG/5ML PO SOLN: 30.0000 mL | ORAL | Status: DC | PRN

## 2019-04-29 MED ORDER — DIBUCAINE (PERIANAL) 1 % EX OINT: 1.0000 "application " | TOPICAL_OINTMENT | CUTANEOUS | Status: DC | PRN

## 2019-04-29 MED ORDER — ZOLPIDEM TARTRATE 5 MG PO TABS: 5.0000 mg | ORAL_TABLET | Freq: Every evening | ORAL | Status: DC | PRN

## 2019-04-29 MED ORDER — BENZOCAINE-MENTHOL 20-0.5 % EX AERO: 1.0000 "application " | INHALATION_SPRAY | CUTANEOUS | Status: DC | PRN

## 2019-04-29 MED ORDER — OXYTOCIN 40 UNITS IN NORMAL SALINE INFUSION - SIMPLE MED
1.0000 m[IU]/min | INTRAVENOUS | Status: DC
  Filled 2019-04-29: qty 1000

## 2019-04-29 MED ORDER — PHENYLEPHRINE 40 MCG/ML (10ML) SYRINGE FOR IV PUSH (FOR BLOOD PRESSURE SUPPORT)
80.0000 ug | PREFILLED_SYRINGE | INTRAVENOUS | Status: DC | PRN
  Administered 2019-04-29 (×2): 80 ug via INTRAVENOUS
  Filled 2019-04-29: qty 10

## 2019-04-29 MED ORDER — LACTATED RINGERS IV SOLN
INTRAVENOUS | Status: DC
  Administered 2019-04-29 (×3): via INTRAVENOUS

## 2019-04-29 MED ORDER — LIDOCAINE HCL (PF) 1 % IJ SOLN
INTRAMUSCULAR | Status: DC | PRN
  Administered 2019-04-29: 10 mL via EPIDURAL

## 2019-04-29 MED ORDER — ACETAMINOPHEN 325 MG PO TABS
650.0000 mg | ORAL_TABLET | ORAL | Status: DC | PRN
  Administered 2019-04-30: 650 mg via ORAL
  Filled 2019-04-29: qty 2

## 2019-04-29 MED ORDER — LACTATED RINGERS IV SOLN
500.0000 mL | INTRAVENOUS | Status: DC | PRN
  Administered 2019-04-29 (×2): 500 mL via INTRAVENOUS
  Administered 2019-04-29: 1000 mL via INTRAVENOUS

## 2019-04-29 MED ORDER — FENTANYL-BUPIVACAINE-NACL 0.5-0.125-0.9 MG/250ML-% EP SOLN
12.0000 mL/h | EPIDURAL | Status: DC | PRN
  Filled 2019-04-29: qty 250

## 2019-04-29 MED ORDER — SENNOSIDES-DOCUSATE SODIUM 8.6-50 MG PO TABS
2.0000 | ORAL_TABLET | ORAL | Status: DC
  Administered 2019-04-30 (×2): 2 via ORAL
  Filled 2019-04-29 (×2): qty 2

## 2019-04-29 MED ORDER — ACETAMINOPHEN 325 MG PO TABS: 650.0000 mg | ORAL_TABLET | ORAL | Status: DC | PRN

## 2019-04-29 MED ORDER — PHENYLEPHRINE 40 MCG/ML (10ML) SYRINGE FOR IV PUSH (FOR BLOOD PRESSURE SUPPORT): 80.0000 ug | PREFILLED_SYRINGE | INTRAVENOUS | Status: DC | PRN

## 2019-04-29 MED ORDER — DIPHENHYDRAMINE HCL 50 MG/ML IJ SOLN: 12.5000 mg | INTRAMUSCULAR | Status: DC | PRN

## 2019-04-29 MED ORDER — OXYTOCIN BOLUS FROM INFUSION: 500.0000 mL | Freq: Once | INTRAVENOUS | Status: DC

## 2019-04-29 MED ORDER — ONDANSETRON HCL 4 MG/2ML IJ SOLN: 4.0000 mg | Freq: Four times a day (QID) | INTRAMUSCULAR | Status: DC | PRN

## 2019-04-29 MED ORDER — LIDOCAINE HCL (PF) 1 % IJ SOLN: 30.0000 mL | INTRAMUSCULAR | Status: DC | PRN

## 2019-04-29 MED ORDER — FLEET ENEMA 7-19 GM/118ML RE ENEM: 1.0000 | ENEMA | RECTAL | Status: DC | PRN

## 2019-04-29 MED ORDER — IBUPROFEN 600 MG PO TABS
600.0000 mg | ORAL_TABLET | Freq: Four times a day (QID) | ORAL | Status: DC
  Administered 2019-04-29 – 2019-05-01 (×6): 600 mg via ORAL
  Filled 2019-04-29 (×6): qty 1

## 2019-04-29 MED ORDER — SODIUM CHLORIDE (PF) 0.9 % IJ SOLN
INTRAMUSCULAR | Status: DC | PRN
  Administered 2019-04-29: 12 mL/h via EPIDURAL

## 2019-04-29 MED ORDER — FERROUS SULFATE 325 (65 FE) MG PO TABS
325.0000 mg | ORAL_TABLET | Freq: Two times a day (BID) | ORAL | Status: DC
  Administered 2019-04-29 – 2019-05-01 (×3): 325 mg via ORAL
  Filled 2019-04-29 (×4): qty 1

## 2019-04-29 MED ORDER — LACTATED RINGERS IV SOLN
500.0000 mL | Freq: Once | INTRAVENOUS | Status: AC
  Administered 2019-04-29: 1000 mL via INTRAVENOUS

## 2019-04-29 MED ORDER — OXYTOCIN 40 UNITS IN NORMAL SALINE INFUSION - SIMPLE MED: 2.5000 [IU]/h | INTRAVENOUS | Status: DC

## 2019-04-29 MED ORDER — MISOPROSTOL 25 MCG QUARTER TABLET
25.0000 ug | ORAL_TABLET | ORAL | Status: DC | PRN
  Administered 2019-04-29: 25 ug via VAGINAL
  Filled 2019-04-29: qty 1

## 2019-04-29 MED ORDER — ONDANSETRON HCL 4 MG PO TABS: 4.0000 mg | ORAL_TABLET | ORAL | Status: DC | PRN

## 2019-04-29 MED ORDER — WITCH HAZEL-GLYCERIN EX PADS: 1.0000 "application " | MEDICATED_PAD | CUTANEOUS | Status: DC | PRN

## 2019-04-29 MED ORDER — SIMETHICONE 80 MG PO CHEW: 80.0000 mg | CHEWABLE_TABLET | ORAL | Status: DC | PRN

## 2019-04-29 MED ORDER — MEASLES, MUMPS & RUBELLA VAC IJ SOLR: 0.5000 mL | Freq: Once | INTRAMUSCULAR | Status: DC

## 2019-04-29 NOTE — Progress Notes (Signed)
Subjective: Called by nurse to evaluate strip.  Cytotec at 0100 of 25 mcg vaginal.  EFM showed some variables and lates.  Resolved with position changes and IV fluids. Pt breathing with contractions and noted to be 4 cm.  Objective: BP 114/67   Pulse 82   Temp 98 F (36.7 C) (Oral)   Resp 19   Ht 5\' 2"  (1.575 m)   Wt 66.5 kg   LMP 08/13/2018   SpO2 100%   BMI 26.80 kg/m  No intake/output data recorded. Total I/O In: 567.9 [I.V.:567.9] Out: -   FHT: Category 2 FHTBL 150 variability present variable decel with occ lates  AROM clear fluid IUPC placed, pt noted to have tachysystole, Given sq terbutaline with resolution of FHT.  FHT 145 accels no decels Ctx every 4 minuts.   SVE:   Dilation: 3.5 Effacement (%): 80 Station: 0 Exam by:: Irene Shipper, CNM  Assessment:  G1 at 37 weeks induction for IUGR Cat 12 strip  Plan: Start pitocin augmentation after one hour if needed.  Dominique Travis CNM, MSN 04/29/2019, 6:30 AM

## 2019-04-29 NOTE — Lactation Note (Signed)
This note was copied from a baby's chart. Lactation Consultation Note Baby 6 hrs old. Has only been bottle feeding. Mom sleeping, FOB holding baby STS. Gave FOB LPI information sheet and reviewed. Told FOB LC would be back to speak w/mom when she awakens.  Mom doesn't like BF. Mom states she chooses to pump and bottle feed. LC brought DEBP and kit to rm. Mom waking up. Mom request hand pump. LC brought one to rm. Parents request to give Enfamil formula. Baby has been getting 20 cal. Enfamil. [facility] don't carry Enfamil 22 cal. NICU doesn't carry it. Parents do not want to give Similac 22 cal. Discussed reasoning of why baby needs higher calories d/t low BW.  Taught mom hand expression, mom demonstrated back. Clear colostrum expressed easily. Mom appears still pretty sleepy. Discussed mom pumping Q 3 hrs then hand expressing afterwards. Collected approx. 1 ml colostrum. Mom asked for bottle to give it in. LC explained spoon feed small amounts, get more volume then you can give in a bottle. Gave feeding amount for formula feeding since the baby isn't going to the breast. Encouraged to read LPI information sheet to see how to care for small babies. Mom encouraged to feed baby 8-12 times/24 hours and with feeding cues. Mom encouraged to feed baby 8-12 times/24 hours and with feeding cues.  Milk storage discussed. Encouraged to call for questions or concerns.   Patient Name: Dominique Travis Date: 04/29/2019 Reason for consult: Initial assessment;Primapara;Early term 37-38.6wks;Infant < 6lbs   Maternal Data Has patient been taught Hand Expression?: Yes Does the patient have breastfeeding experience prior to this delivery?: No  Feeding Feeding Type: Bottle Fed - Formula Nipple Type: Slow - flow  LATCH Score       Type of Nipple: Everted at rest and after stimulation  Comfort (Breast/Nipple): Soft / non-tender        Interventions Interventions: Hand express  Lactation  Tools Discussed/Used WIC Program: No Pump Review: (ask RN to set up)   Consult Status Consult Status: Follow-up Date: 04/30/19 Follow-up type: In-patient    Theodoro Kalata 04/29/2019, 10:41 PM

## 2019-04-29 NOTE — Progress Notes (Signed)
Subjective: Postpartum Day # 1 : S/P NSVD due to IOL for IUGR, baby female weight 4.8lbs at birth, no early discharge due to SGA & low birth weight. Patient up ad lib, denies syncope or dizziness. Reports consuming regular diet without issues and denies N/V. Patient reports 0 bowel movement + passing flatus.  Denies issues with urination and reports bleeding is "light."  Patient is bottle and breastfeeding and reports going well.  Desires undecided for postpartum contraception.  Pain is being appropriately managed with use of po meds.   vulvar laceration Feeding:  Breast/bottle Contraceptive plan:  undecided Baby female  Objective: Vital signs in last 24 hours: Patient Vitals for the past 24 hrs:  BP Temp Temp src Pulse Resp SpO2  04/30/19 0330 119/79 98.6 F (37 C) Oral 78 18 99 %  04/30/19 0000 117/73 98.2 F (36.8 C) Oral 88 16 99 %  04/29/19 1935 119/75 98.6 F (37 C) Oral 87 16 99 %  04/29/19 1640 116/81 98.1 F (36.7 C) Oral 83 20 100 %  04/29/19 1546 116/78 97.8 F (36.6 C) Oral 81 20 100 %  04/29/19 1431 107/64 - - 80 18 -  04/29/19 1401 103/69 - - (!) 103 20 -  04/29/19 1347 107/76 - - (!) 105 20 -  04/29/19 1331 111/76 - - (!) 110 20 -  04/29/19 1316 (!) 116/93 - - (!) 107 20 -  04/29/19 1300 108/83 - - - 20 -  04/29/19 1241 99/67 - - 99 18 -  04/29/19 1231 (!) 87/48 98.5 F (36.9 C) Oral 84 18 -  04/29/19 1201 110/64 - - 79 20 -  04/29/19 1136 116/64 - - 79 20 -  04/29/19 1101 (!) 108/49 - - 84 20 -  04/29/19 1038 (!) 116/59 - - 79 - -  04/29/19 1031 - 98.5 F (36.9 C) Oral - 20 -  04/29/19 1011 106/72 - - 86 20 99 %  04/29/19 1006 - - - - - 100 %  04/29/19 1000 98/66 - - (!) 103 16 100 %  04/29/19 0956 110/73 - - 81 18 -  04/29/19 0951 (!) 99/54 - - 94 18 -  04/29/19 0946 119/84 - - 74 18 100 %  04/29/19 0941 119/76 - - 81 18 100 %  04/29/19 0936 110/78 - - 83 20 100 %  04/29/19 0931 114/73 - - 89 20 99 %  04/29/19 0926 118/82 - - 86 20 100 %  04/29/19 0921  122/80 - - 88 18 -  04/29/19 0916 110/78 - - (!) 104 20 -  04/29/19 0915 130/88 - - (!) 102 20 -  04/29/19 0809 113/68 97.9 F (36.6 C) Oral 89 18 -  04/29/19 0716 - - - - 18 96 %  04/29/19 0701 106/69 - - (!) 104 16 -     Physical Exam:  General: alert, cooperative, appears stated age and no distress Mood/Affect: Happy Lungs: clear to auscultation, no wheezes, rales or rhonchi, symmetric air entry.  Heart: normal rate, regular rhythm, normal S1, S2, no murmurs, rubs, clicks or gallops. Breast: breasts appear normal, no suspicious masses, no skin or nipple changes or axillary nodes. Abdomen:  + bowel sounds, soft, non-tender GU: perineum approximate, healing well. No signs of external hematomas.  Uterine Fundus: firm Lochia: appropriate Skin: Warm, Dry. DVT Evaluation: No evidence of DVT seen on physical exam. Negative Homan's sign. No cords or calf tenderness. No significant calf/ankle edema.  CBC Latest Ref Rng & Units  04/29/2019 08/12/2018 01/17/2018  WBC 4.0 - 10.5 K/uL 10.8(H) 10.0 7.6  Hemoglobin 12.0 - 15.0 g/dL 11.1(L) 12.8 12.1  Hematocrit 36.0 - 46.0 % 32.2(L) 37.0 36.3  Platelets 150 - 400 K/uL 332 417(H) 430(H)    No results found for this or any previous visit (from the past 24 hour(s)).   CBG (last 3)  No results for input(s): GLUCAP in the last 72 hours.   I/O last 3 completed shifts: In: 567.9 [I.V.:567.9] Out: 2238 [Urine:1925; Blood:313]   Assessment Postpartum Day # 0 : S/P NSVD due to IOL for IUGR, baby female weight 4.8lbs at birth, no early discharge due to SGA & low birth weight. Pt stable. -1 involution. breastfeeding. Hemodynamically stable with hgb drop from 11.1-pending.   Plan: Continue other mgmt as ordered VTE prophylactics: Early ambulated as tolerates.  Pain control: Motrin/Tylenol PRN Education given regarding options for contraception, including barrier methods, injectable contraception, IUD placement, oral contraceptives.  Plan for  discharge tomorrow, Breastfeeding and Lactation consult   Dr. Charlesetta Garibaldi to be updated on patient status @ 0700 when she assumes care of the pt.  [facility] NP-C, CNM 04/30/2019, 6:49 AM

## 2019-04-29 NOTE — Anesthesia Preprocedure Evaluation (Signed)

## 2019-04-29 NOTE — Progress Notes (Signed)
  Subjective:  Comfortable with  Epidural MVU 200    Objective: BP (!) 116/59   Pulse 79   Temp 98.5 F (36.9 C) (Oral)   Resp 20   Ht 5\' 2"  (1.575 m)   Wt 66.5 kg   LMP 08/13/2018   SpO2 99%   BMI 26.80 kg/m  I/O last 3 completed shifts: In: 567.9 [I.V.:567.9] Out: -  Total I/O In: -  Out: 250 [Urine:250]  FHT:  Baseline 145-150 with good variability but deeper early decelerations to 80 bpm SVE:   Dilation: 8 Effacement (%): 90(edematous) Station: 0 LOP: patient moved to right exaggerated SIMMS Exam by:: Dr. Cletis Media   IUPC reinserted for amnioinfusion  Labs: Lab Results  Component Value Date   WBC 10.8 (H) 04/29/2019   HGB 11.1 (L) 04/29/2019   HCT 32.2 (L) 04/29/2019   MCV 77.6 (L) 04/29/2019   PLT 332 04/29/2019    Assessment / Plan: Spontaneous labor, progressing normally Fetal Wellbeing: reassuring but will monitor closely Anticipated MOD:  NSVD  Discussed with patient the possibility of cesarean section should fetal tracing worsen. Patient agreeable.  Cesarean section reviewed with pt with R&B including but not limited to:  bleeding, infection, injury to other organs. Low transverse approach planned which will allow vaginal delivery with future pregnancies. Should a vertical incision or inverted T be needed, patient is aware that repeat cesarean sections would be recommended in the future. Expected hospital stay and recovery also discussed.   Katharine Look A Ashtan Laton 04/29/2019, 12:21 PM

## 2019-04-29 NOTE — Anesthesia Procedure Notes (Signed)
Epidural Patient location during procedure: OB Start time: 04/29/2019 9:06 AM End time: 04/29/2019 9:17 AM  Staffing Anesthesiologist: Lidia Collum, MD Performed: anesthesiologist   Preanesthetic Checklist Completed: patient identified, pre-op evaluation, timeout performed, IV checked, risks and benefits discussed and monitors and equipment checked  Epidural Patient position: sitting Prep: DuraPrep Patient monitoring: heart rate, continuous pulse ox and blood pressure Approach: midline Location: L3-L4 Injection technique: LOR air  Needle:  Needle type: Tuohy  Needle gauge: 17 G Needle length: 9 cm Needle insertion depth: 6 cm Catheter type: closed end flexible Catheter size: 19 Gauge Catheter at skin depth: 11 cm Test dose: negative  Assessment Events: blood not aspirated, injection not painful, no injection resistance, negative IV test and no paresthesia  Additional Notes Reason for block:procedure for pain

## 2019-04-29 NOTE — Progress Notes (Signed)
Dominique Travis is a 18 y.o. G1P0 at 51w0dadmitted for induction of labor due to Poor fetal growth. Received Cytotec x1 at 1:00 am which led to tachysystoly. Resolved by AROM and Terbutaline at 6:00.  Subjective:  Comfortable with  Epidural 45 minutes ago with resulting decrease in BP corrected with LR bolus and 1 dose of Phenylephrine MVU vary 180-220    Objective: BP 106/72   Pulse 86   Temp 97.9 F (36.6 C) (Oral)   Resp 20   Ht 5\' 2"  (1.575 m)   Wt 66.5 kg   LMP 08/13/2018   SpO2 99%   BMI 26.80 kg/m  I/O last 3 completed shifts: In: 567.9 [I.V.:567.9] Out: -  No intake/output data recorded.  FHT:  Category 1 with early decelerations and good fetal scalp stimulation SVE:   Dilation: 5.5 Effacement (%): 100 Station: 0 Exam by:: Dr. Cletis Media  Labs: Lab Results  Component Value Date   WBC 10.8 (H) 04/29/2019   HGB 11.1 (L) 04/29/2019   HCT 32.2 (L) 04/29/2019   MCV 77.6 (L) 04/29/2019   PLT 332 04/29/2019    Assessment / Plan: Induction of labor due to IUGR,  progressing well after 1 dose of Cytotec Fetal Wellbeing: reassuring Anticipated MOD:  NSVD  Katharine Look A Rayshaun Needle 04/29/2019, 10:46 AM

## 2019-04-29 NOTE — H&P (Signed)
Dominique Travis is a 18 y.o. female, G1P0 at 21 weeks, presenting for induction of labor for IUGR  FM +  Denies bloody show or leakage of fluid..  Patient Active Problem List   Diagnosis Date Noted  . IUGR (intrauterine growth restriction) affecting care of mother, third trimester, not applicable or unspecified fetus 04/29/2019  . Supervision of other normal pregnancy, antepartum 11/29/2018  . Chlamydia 11/29/2018    History of present pregnancy: Patient entered care at 20  weeks.   EDC of 05/20/2019 was established by LMP.   Anatomy scan: 20  weeks, with normal findings and an posterior placenta.   Additional [place] evaluations:  Monthly growth with IUGR noted Significant prenatal events: IUGR Last evaluation:  Last week  OB History    Gravida  1   Para      Term      Preterm      AB      Living  0     SAB      TAB      Ectopic      Multiple      Live Births             Past Medical History:  Diagnosis Date  . Hx of chlamydia infection   . Medical history non-contributory    Past Surgical History:  Procedure Laterality Date  . HERNIA REPAIR     Family History: family history includes Diabetes in her mother; Hypertension in her mother. Social History:  reports that she has never smoked. She has never used smokeless tobacco. She reports that she does not drink alcohol or use drugs.   Prenatal Transfer Tool  Maternal Diabetes: No Genetic Screening: too late Maternal Ultrasounds/Referrals: IUGR Fetal Ultrasounds or other Referrals:  Referred to Materal Fetal Medicine  Maternal Substance Abuse:  No Significant Maternal Medications:  None Significant Maternal Lab Results: Other:   TDAP Declined Flu Declined  ROS:  All 10 systems reviewed and negative except as stated above  No Known Allergies     Last menstrual period 08/13/2018.  Chest clear Heart RRR without murmur Abd gravid, NT, FH appropriate Pelvic: per RN Ext: No edema  FHR: Category  1 UCs:  none  Prenatal labs: ABO, Rh:  B positive Antibody:  NR Rubella:   Imm RPR:   NEG HBsAg:   NEG HIV:   NR GBS:  Neg Sickle cell/Hgb electrophoresis:  SCT GC:  Neg Chlamydia:  Postive in August negative at [place] and  36 weeks Genetic screenings:  Too late Glucola: wnl Other:   Hgb today 11.1       Assessment/Plan: IUP at 37 weeks induction for IUGR Cat 1 strip  Plan: Admit to Birthing Suite  Routine [facility] orders Pain med/epidural prn Cytotec for ripening  Pleas Koch ProtheroCNM, MSN 04/29/2019, 12:11 AM

## 2019-04-29 NOTE — Progress Notes (Signed)
Upon assessment to patient's room, patient was in shower per husband.  Husband and patient notified that induction would be delayed and nursing staff has orders to place IV and continue with admission and placement of method of induction.  Patient verbalized understanding, states, "I'll hurry."  Cassie Freer, RN 04/29/19 [phone]

## 2019-04-30 ENCOUNTER — Other Ambulatory Visit: Payer: Self-pay

## 2019-04-30 LAB — CBC
HCT: 26.1 % — ABNORMAL LOW (ref 36.0–46.0)
Hemoglobin: 8.9 g/dL — ABNORMAL LOW (ref 12.0–15.0)
MCH: 26.5 pg (ref 26.0–34.0)
MCHC: 34.1 g/dL (ref 30.0–36.0)
MCV: 77.7 fL — ABNORMAL LOW (ref 80.0–100.0)
Platelets: 262 10*3/uL (ref 150–400)
RBC: 3.36 MIL/uL — ABNORMAL LOW (ref 3.87–5.11)
RDW: 13.8 % (ref 11.5–15.5)
WBC: 13.8 10*3/uL — ABNORMAL HIGH (ref 4.0–10.5)
nRBC: 0 % (ref 0.0–0.2)

## 2019-04-30 NOTE — Lactation Note (Signed)
This note was copied from a baby's chart. Lactation Consultation Note  Patient Name: Dominique Travis [id number] Date: 04/30/2019   Infant is 52 hrs old & Mom recently pumped 23 ml (Mom said she's been leaking since [redacted] weeks gestation). Mom plans to give the EBM at the next feeding.  Parents have been bottle feeding using the regular Similac nipple & the yellow Similac slow-flow nipple. Dad says that the infant "chugs" with the regular nipples. I clarified for Dad that we do not want infants to drink too fast/hard when bottle feeding. Mom says she has observed infant bottle feed well with the yellow slow-flow nipple. I removed the regular nipples from the room & provided more of the yellow slow-flow nipples. I also told parents that there is a slower-flow nipple if they feel that infant would benefit from that. Mom said infant will drink EBM without difficulty, but does not want much formula when bottle feeding.  Washing pump parts was discussed with Mom.   Mom said that the infant was "lazy" & wanted to sleep all the time. I asked parents if they are waking the infant to feed & they said, yes. I encouraged parents to feed infant as much as she wants & they said they would.   Matthias Hughs [facility] 04/30/2019, 2:32 PM

## 2019-04-30 NOTE — Lactation Note (Signed)
This note was copied from a baby's chart. Lactation Consultation Note  Patient Name: [name] [id number] Date: 04/30/2019   Infant is 76 hrs old & the last feeding in chart shows that infant drank 5 ml of formula. I spoke with Windy Carina, RN. RN says she has reviewed intake volume parameters with parents. She said that parents are preparing to bottle feed now. It appears that infant has not fed since 1000. RN stated that the parents have been sleeping "all morning." RN says that to her knowledge, infant is not having a difficult time with bottle feeding.   Matthias Hughs [facility] 04/30/2019, 2:10 PM

## 2019-04-30 NOTE — Anesthesia Postprocedure Evaluation (Signed)
Anesthesia Post Note  Patient: Dominique Travis  Procedure(s) Performed: AN AD HOC LABOR EPIDURAL     Patient location during evaluation: Mother Baby Anesthesia Type: Epidural Level of consciousness: awake and alert and oriented Pain management: satisfactory to patient Vital Signs Assessment: post-procedure vital signs reviewed and stable Respiratory status: respiratory function stable Cardiovascular status: stable Postop Assessment: no headache, no backache, epidural receding, patient able to bend at knees, no signs of nausea or vomiting and adequate PO intake Anesthetic complications: no Comments: The epidural provided good pain relief. The patient commented that her left side stayed numb for quite awhile- 6 hoursl longer than than her right side which was both annoying and concerning.    Last Vitals:  Vitals:   04/30/19 0330 04/30/19 1444  BP: 119/79 98/65  Pulse: 78 89  Resp: 18 16  Temp: 37 C 36.8 C  SpO2: 99%     Last Pain:  Vitals:   04/30/19 1544  TempSrc:   PainSc: Asleep   Pain Goal: Patients Stated Pain Goal: 10 (04/29/19 0716)                 Katherina Mires

## 2019-05-01 LAB — SURGICAL PATHOLOGY

## 2019-05-01 MED ORDER — IBUPROFEN 600 MG PO TABS: 600.0000 mg | ORAL_TABLET | Freq: Four times a day (QID) | ORAL | 0 refills | Status: DC

## 2019-05-01 NOTE — Discharge Summary (Signed)
OB Discharge Summary     Patient Name: Dominique Travis DOB: September 21, 2000 MRN: [id number]  Date of admission: 04/29/2019 Delivering MD: Delsa Bern   Date of discharge: 05/01/2019  Admitting diagnosis: PREG Intrauterine pregnancy: [redacted]w[redacted]d     Secondary diagnosis:  Active Problems:   IUGR (intrauterine growth restriction) affecting care of mother, third trimester, not applicable or unspecified fetus   SVD (spontaneous vaginal delivery)  Additional problems: Hx of chlamydia, treated     Discharge diagnosis: Term Pregnancy Delivered                                                                                                Post partum procedures:None  Augmentation: AROM and Cytotec  Complications: None  Hospital course:  Induction of Labor With Vaginal Delivery   18 y.o. yo G1P1001 at [redacted]w[redacted]d was admitted to the hospital 04/29/2019 for induction of labor.  Indication for induction: Intrauterine growth restriction.  Patient had an uncomplicated labor course as follows: Membrane Rupture Time/Date: 6:04 AM ,04/29/2019   Intrapartum Procedures: Episiotomy: None [1]                                         Lacerations:  Labial [10]  Patient had delivery of a Viable infant.  Information for the patient's newborn:  Pinki, Rottman [[id number]]  Delivery Method: Vaginal, Vacuum (Extractor)(Filed from Delivery Summary)    04/29/2019  Details of delivery can be found in separate delivery note.  Patient had a routine postpartum course. Patient is discharged home 05/01/19.  Physical exam  Vitals:   04/30/19 0330 04/30/19 1444 04/30/19 2330 05/01/19 0520  BP: 119/79 98/65 104/66 124/79  Pulse: 78 89 91 83  Resp: 18 16 18 18   Temp: 98.6 F (37 C) 98.2 F (36.8 C) 98.8 F (37.1 C) 97.9 F (36.6 C)  TempSrc: Oral Oral Oral Oral  SpO2: 99%   100%  Weight:      Height:       General: alert, cooperative and no distress Lochia: appropriate Uterine Fundus: firm Perineum:  non-edematous DVT Evaluation: No evidence of DVT seen on physical exam. No cords or calf tenderness. No significant calf/ankle edema. Labs: Lab Results  Component Value Date   WBC 13.8 (H) 04/30/2019   HGB 8.9 (L) 04/30/2019   HCT 26.1 (L) 04/30/2019   MCV 77.7 (L) 04/30/2019   PLT 262 04/30/2019   CMP Latest Ref Rng & Units 08/12/2018  Glucose 70 - 99 mg/dL 86  BUN 6 - 20 mg/dL 17  Creatinine 0.44 - 1.00 mg/dL 0.76  Sodium 135 - 145 mmol/L 137  Potassium 3.5 - 5.1 mmol/L 3.4(L)  Chloride 98 - 111 mmol/L 105  CO2 22 - 32 mmol/L 23  Calcium 8.9 - 10.3 mg/dL 8.9  Total Protein 6.5 - 8.1 g/dL -  Total Bilirubin 0.3 - 1.2 mg/dL -  Alkaline Phos 38 - 126 U/L -  AST 15 - 41 U/L -  ALT 0 - 44 U/L -  Discharge instruction: per After Visit Summary and "Baby and Me Booklet".  After visit meds:  Allergies as of 05/01/2019   No Known Allergies     Medication List    TAKE these medications   ibuprofen 600 MG tablet Commonly known as: ADVIL Take 1 tablet (600 mg total) by mouth every 6 (six) hours.       Diet: routine diet  Activity: Advance as tolerated. Pelvic rest for 6 weeks.   Outpatient follow up:6 weeks Follow up Appt:No future appointments. Follow up Visit:No follow-ups on file.  Postpartum contraception: Undecided  Newborn Data: Live born female  Birth Weight: 4 lb 8.5 oz (2055 g) APGAR: 9, 9  Newborn Delivery   Birth date/time: 04/29/2019 12:53:00 Delivery type: Vaginal, Vacuum (Extractor)      Baby Feeding: Bottle and Breast Disposition:home with mother   05/01/2019 Roma Schanz, CNM

## 2019-05-01 NOTE — Lactation Note (Signed)
This note was copied from a baby's chart. Lactation Consultation Note  Patient Name: Dominique Travis [id number] Date: 05/01/2019 Reason for consult: Follow-up assessment  P1 mother whose infant is now 13 hours old.  This is an ETI at 37+0 weeks weighing <6 lbs.  Mother has not been putting baby to the breast regularly.  When questioned about her feeding plans, father announced, "We are going to be breast feeding" while mother did not respond.  Mother happily informed me that she is now getting about 20 mls when she pumps.  Praised mother's efforts and encouraged to feed back any EBM she obtains to baby.    Suggested she continue to put baby to breast first prior to any supplementation.  Continue hand expression before/after feedings to help increase milk supply.  Father asked if there was anything she could eat to "make the milk come in faster" even though mother was aware that she just needs to keep feeding/pumping.  Reviewed the concept of milk coming to volume and asked mother to be diligent about breast feeding and post pumping and to give her body another day or two for the milk to come to full supply.  Mother verbalized understanding.  Parents have no difficulty using the bottle to supplement baby.  Encouraged to continue feeding every three hours or sooner if baby shows feeding cues.    Mother has a DEBP for home use and was given a manual pump by her RN.  She has our OP phone number for questions after discharge.  Provided information on our website and breast feeding support group if mother is interested.  Parents are ready for discharge.  RN informed.   Maternal Data    Feeding Feeding Type: Bottle Fed - Formula  LATCH Score                   Interventions    Lactation Tools Discussed/Used     Consult Status Consult Status: Complete Date: 05/01/19 Follow-up type: Call as needed    Deshannon Hinchliffe R Areatha Kalata 05/01/2019, 10:18 AM

## 2019-09-06 ENCOUNTER — Encounter (HOSPITAL_COMMUNITY): Payer: Self-pay | Admitting: Emergency Medicine

## 2019-09-06 ENCOUNTER — Emergency Department (HOSPITAL_COMMUNITY)
Admission: EM | Admit: 2019-09-06 | Discharge: 2019-09-06 | Disposition: A | Discharge status: Home / Self Care | Payer: Medicaid Other | Attending: Emergency Medicine | Admitting: Emergency Medicine

## 2019-09-06 ENCOUNTER — Other Ambulatory Visit: Payer: Self-pay

## 2019-09-06 DIAGNOSIS — Z332 Encounter for elective termination of pregnancy: Secondary | ICD-10-CM | POA: Insufficient documentation

## 2019-09-06 DIAGNOSIS — R531 Weakness: Secondary | ICD-10-CM | POA: Insufficient documentation

## 2019-09-06 DIAGNOSIS — N939 Abnormal uterine and vaginal bleeding, unspecified: Secondary | ICD-10-CM | POA: Diagnosis not present

## 2019-09-06 DIAGNOSIS — R112 Nausea with vomiting, unspecified: Secondary | ICD-10-CM | POA: Insufficient documentation

## 2019-09-06 DIAGNOSIS — R102 Pelvic and perineal pain: Secondary | ICD-10-CM | POA: Insufficient documentation

## 2019-09-06 LAB — CBC WITH DIFFERENTIAL/PLATELET
Abs Immature Granulocytes: 0.07 10*3/uL (ref 0.00–0.07)
Basophils Absolute: 0.1 10*3/uL (ref 0.0–0.1)
Basophils Relative: 0 %
Eosinophils Absolute: 0 10*3/uL (ref 0.0–0.5)
Eosinophils Relative: 0 %
HCT: 41 % (ref 36.0–46.0)
Hemoglobin: 13.9 g/dL (ref 12.0–15.0)
Immature Granulocytes: 0 %
Lymphocytes Relative: 12 %
Lymphs Abs: 2 10*3/uL (ref 0.7–4.0)
MCH: 26.3 pg (ref 26.0–34.0)
MCHC: 33.9 g/dL (ref 30.0–36.0)
MCV: 77.7 fL — ABNORMAL LOW (ref 80.0–100.0)
Monocytes Absolute: 0.9 10*3/uL (ref 0.1–1.0)
Monocytes Relative: 5 %
Neutro Abs: 13.5 10*3/uL — ABNORMAL HIGH (ref 1.7–7.7)
Neutrophils Relative %: 83 %
Platelets: 486 10*3/uL — ABNORMAL HIGH (ref 150–400)
RBC: 5.28 MIL/uL — ABNORMAL HIGH (ref 3.87–5.11)
RDW: 14.6 % (ref 11.5–15.5)
WBC: 16.6 10*3/uL — ABNORMAL HIGH (ref 4.0–10.5)
nRBC: 0 % (ref 0.0–0.2)

## 2019-09-06 LAB — COMPREHENSIVE METABOLIC PANEL
ALT: 44 U/L (ref 0–44)
AST: 27 U/L (ref 15–41)
Albumin: 4.6 g/dL (ref 3.5–5.0)
Alkaline Phosphatase: 53 U/L (ref 38–126)
Anion gap: 13 (ref 5–15)
BUN: 17 mg/dL (ref 6–20)
CO2: 21 mmol/L — ABNORMAL LOW (ref 22–32)
Calcium: 9.5 mg/dL (ref 8.9–10.3)
Chloride: 105 mmol/L (ref 98–111)
Creatinine, Ser: 0.73 mg/dL (ref 0.44–1.00)
GFR calc Af Amer: >60 mL/min (ref >60)
GFR calc non Af Amer: >60 mL/min (ref >60)
Glucose, Bld: 91 mg/dL (ref 70–99)
Potassium: 3.1 mmol/L — ABNORMAL LOW (ref 3.5–5.1)
Sodium: 139 mmol/L (ref 135–145)
Total Bilirubin: 1.8 mg/dL — ABNORMAL HIGH (ref 0.3–1.2)
Total Protein: 8.9 g/dL — ABNORMAL HIGH (ref 6.5–8.1)

## 2019-09-06 LAB — LIPASE, BLOOD: Lipase: 32 U/L (ref 11–51)

## 2019-09-06 MED ORDER — PROMETHAZINE HCL 25 MG PO TABS: 25.0000 mg | ORAL_TABLET | Freq: Four times a day (QID) | ORAL | 0 refills | Status: DC | PRN

## 2019-09-06 MED ORDER — ONDANSETRON HCL 4 MG/2ML IJ SOLN
4.0000 mg | Freq: Once | INTRAMUSCULAR | Status: AC
  Administered 2019-09-06: 4 mg via INTRAVENOUS
  Filled 2019-09-06: qty 2

## 2019-09-06 MED ORDER — SODIUM CHLORIDE 0.9 % IV BOLUS
1000.0000 mL | Freq: Once | INTRAVENOUS | Status: AC
  Administered 2019-09-06: 1000 mL via INTRAVENOUS

## 2019-09-06 MED ORDER — KETOROLAC TROMETHAMINE 30 MG/ML IJ SOLN
15.0000 mg | Freq: Once | INTRAMUSCULAR | Status: AC
  Administered 2019-09-06: 05:00:00 15 mg via INTRAVENOUS
  Filled 2019-09-06: qty 1

## 2019-09-06 NOTE — ED Provider Notes (Signed)
[facility] DEPT Provider Note   CSN: [id number] Arrival date & time: 09/06/19  1941     History Chief Complaint  Patient presents with  . Emesis    Dominique Travis is a 19 y.o. female.  Patient presents to the emergency department for evaluation of nausea and vomiting.  Patient reports that symptoms have been ongoing for 1 week.  She states that she has not been able to hold anything down.  She has not had diarrhea or constipation.  No hematemesis.  Patient denies any abdominal pain.  She reports that she has become more and more weak because she cannot eat.  Patient does, however, report that she had a medically induced abortion yesterday.  She neglected to discuss the nausea and vomiting at that time.  She does report that she has had onset of vaginal bleeding.  No significant cramping.  No fever.        Past Medical History:  Diagnosis Date  . Hx of chlamydia infection   . Medical history non-contributory     Patient Active Problem List   Diagnosis Date Noted  . SVD (spontaneous vaginal delivery) 05/01/2019  . IUGR (intrauterine growth restriction) affecting care of mother, third trimester, not applicable or unspecified fetus 04/29/2019  . Supervision of other normal pregnancy, antepartum 11/29/2018  . Chlamydia 11/29/2018    Past Surgical History:  Procedure Laterality Date  . HERNIA REPAIR       OB History    Gravida  1   Para  1   Term  1   Preterm      AB      Living  1     SAB      TAB      Ectopic      Multiple  0   Live Births  1           Family History  Problem Relation Age of Onset  . Hypertension Mother   . Diabetes Mother     Social History   Tobacco Use  . Smoking status: Never Smoker  . Smokeless tobacco: Never Used  Substance Use Topics  . Alcohol use: No  . Drug use: No    Home Medications Prior to Admission medications   Medication Sig Start Date End Date Taking? Authorizing  Provider  ibuprofen (ADVIL) 600 MG tablet Take 1 tablet (600 mg total) by mouth every 6 (six) hours. 05/01/19   Arrie Eastern, CNM  promethazine (PHENERGAN) 25 MG tablet Take 1 tablet (25 mg total) by mouth every 6 (six) hours as needed for nausea or vomiting. 09/06/19   Thomasenia Dowse, Gwenyth Allegra, MD    Allergies    Patient has no known allergies.  Review of Systems   Review of Systems  Gastrointestinal: Positive for nausea and vomiting. Negative for abdominal pain.    Physical Exam Updated Vital Signs BP (!) 97/52 (BP Location: Right Arm)   Pulse 92   Temp 98.8 F (37.1 C) (Oral)   Resp 18   Ht 5\' 2"  (1.575 m)   Wt 59.4 kg   SpO2 100%   BMI 23.96 kg/m   Physical Exam Vitals and nursing note reviewed.  Constitutional:      General: She is not in acute distress.    Appearance: Normal appearance. She is well-developed.  HENT:     Head: Normocephalic and atraumatic.     Right Ear: Hearing normal.     Left Ear: Hearing normal.  Nose: Nose normal.  Eyes:     Conjunctiva/sclera: Conjunctivae normal.     Pupils: Pupils are equal, round, and reactive to light.  Cardiovascular:     Rate and Rhythm: Regular rhythm.     Heart sounds: S1 normal and S2 normal. No murmur. No friction rub. No gallop.   Pulmonary:     Effort: Pulmonary effort is normal. No respiratory distress.     Breath sounds: Normal breath sounds.  Chest:     Chest wall: No tenderness.  Abdominal:     General: Bowel sounds are normal.     Palpations: Abdomen is soft.     Tenderness: There is no abdominal tenderness. There is no guarding or rebound. Negative signs include Murphy's sign and McBurney's sign.     Hernia: No hernia is present.  Musculoskeletal:        General: Normal range of motion.     Cervical back: Normal range of motion and neck supple.  Skin:    General: Skin is warm and dry.     Findings: No rash.  Neurological:     Mental Status: She is alert and oriented to person, place, and time.      GCS: GCS eye subscore is 4. GCS verbal subscore is 5. GCS motor subscore is 6.     Cranial Nerves: No cranial nerve deficit.     Sensory: No sensory deficit.     Coordination: Coordination normal.  Psychiatric:        Speech: Speech normal.        Behavior: Behavior normal.        Thought Content: Thought content normal.     ED Results / Procedures / Treatments   Labs (all labs ordered are listed, but only abnormal results are displayed) Labs Reviewed  CBC WITH DIFFERENTIAL/PLATELET - Abnormal; Notable for the following components:      Result Value   WBC 16.6 (*)    RBC 5.28 (*)    MCV 77.7 (*)    Platelets 486 (*)    Neutro Abs 13.5 (*)    All other components within normal limits  COMPREHENSIVE METABOLIC PANEL - Abnormal; Notable for the following components:   Potassium 3.1 (*)    CO2 21 (*)    Total Protein 8.9 (*)    Total Bilirubin 1.8 (*)    All other components within normal limits  LIPASE, BLOOD  URINALYSIS, ROUTINE W REFLEX MICROSCOPIC    EKG None  Radiology No results found.  Procedures Procedures (including critical care time)  Medications Ordered in ED Medications  sodium chloride 0.9 % bolus 1,000 mL (0 mLs Intravenous Stopped 09/06/19 0556)    Followed by  sodium chloride 0.9 % bolus 1,000 mL (0 mLs Intravenous Stopped 09/06/19 0556)  ondansetron (ZOFRAN) injection 4 mg (4 mg Intravenous Given 09/06/19 0428)  ketorolac (TORADOL) 30 MG/ML injection 15 mg (15 mg Intravenous Given 09/06/19 0447)    ED Course  I have reviewed the triage vital signs and the nursing notes.  Pertinent labs & imaging results that were available during my care of the patient were reviewed by me and considered in my medical decision making (see chart for details).    MDM Rules/Calculators/A&P                      Patient presents to the emergency department for evaluation of nausea and vomiting.  Patient reports that symptoms have been ongoing for 7 days.  Etiology  is unclear, although she was pregnant when the nausea and vomiting began.  Patient underwent medical abortion yesterday.  She has had appropriate pelvic cramping and vaginal bleeding, abdominal exam is benign.  Patient reports significant improvement after hydration.  Color is better she appears much stronger.  She is tolerating oral intake here in the ER.  Will discharge with continued symptomatic treatment.  Final Clinical Impression(s) / ED Diagnoses Final diagnoses:  Non-intractable vomiting with nausea, unspecified vomiting type  Medical abortion    Rx / DC Orders ED Discharge Orders         Ordered    promethazine (PHENERGAN) 25 MG tablet  Every 6 hours PRN     09/06/19 0623           Gilda Crease, MD 09/06/19 [phone]

## 2019-09-06 NOTE — Discharge Instructions (Signed)
Follow-up with [facility] for any ongoing issues with your recent abortion.  If you have excessive bleeding, weakness, dizziness, passing out or fever, go to the ER at [facility].

## 2019-09-06 NOTE — ED Notes (Signed)
Patient decided to stay. Taking patient back to a room.

## 2019-09-06 NOTE — ED Triage Notes (Addendum)
Patient complaining of nauseated and vomiting x 7 days. Cant keep any food down. Patient took abortion pill one week ago.

## 2019-09-25 IMAGING — US US MFM OB TRANSVAGINAL
2 series · 15 of 22 positions shown · non-contrast
Comparison: none

[Series 1: us mfm ob transvaginal · 13 of 19 slices shown (1 of 2)]
[im 1/19]
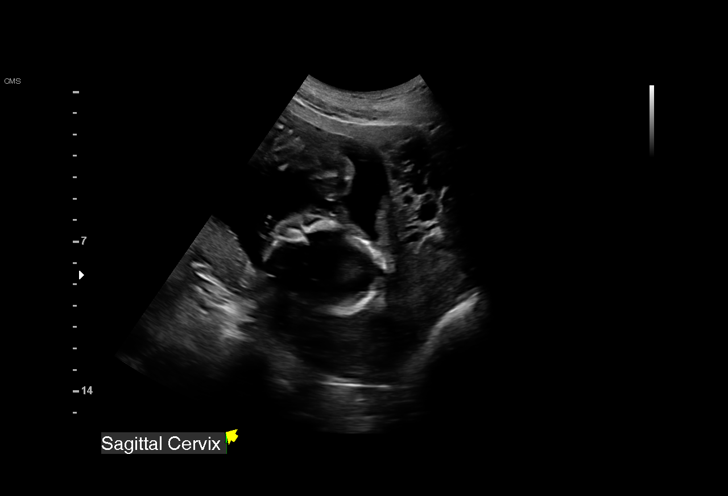
[im 3/19]
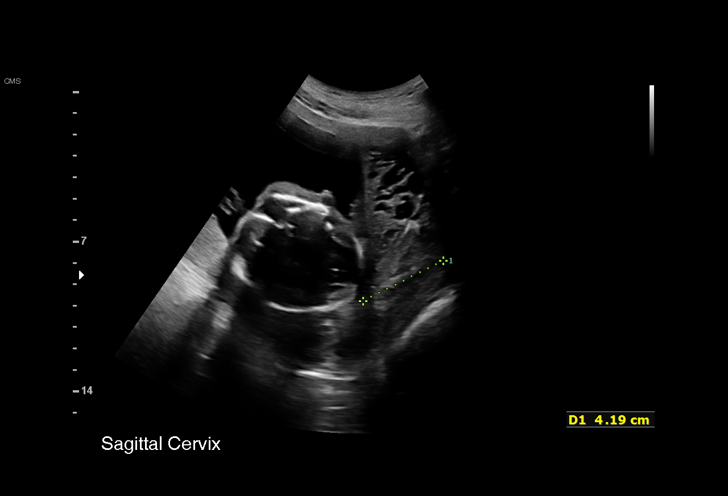
[im 4/19]
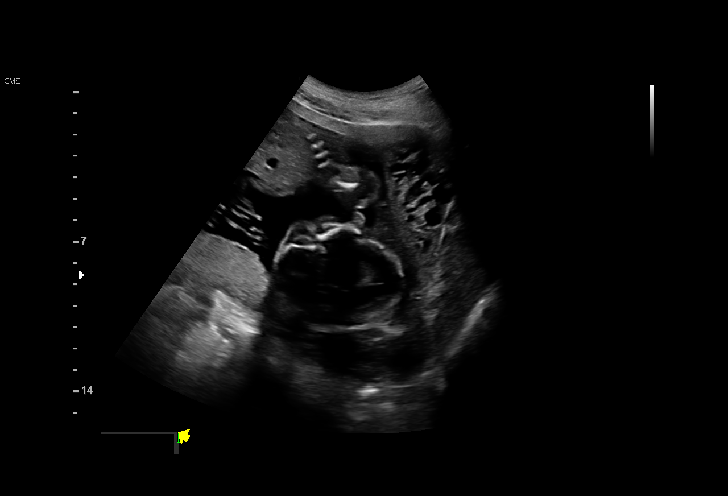
[im 6/19]
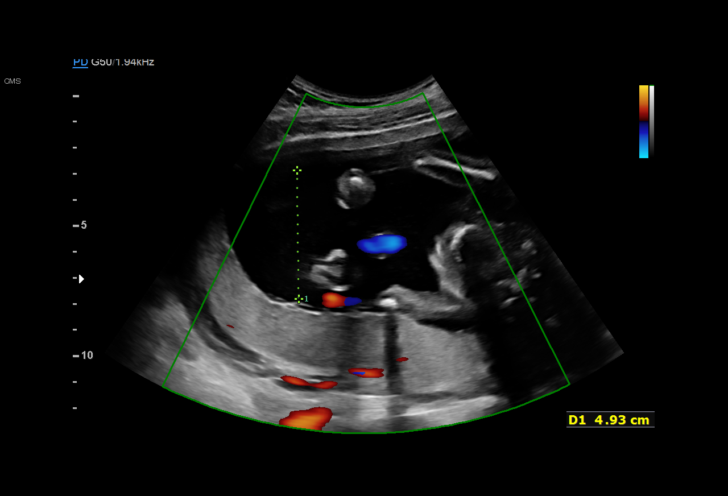
[im 7/19]
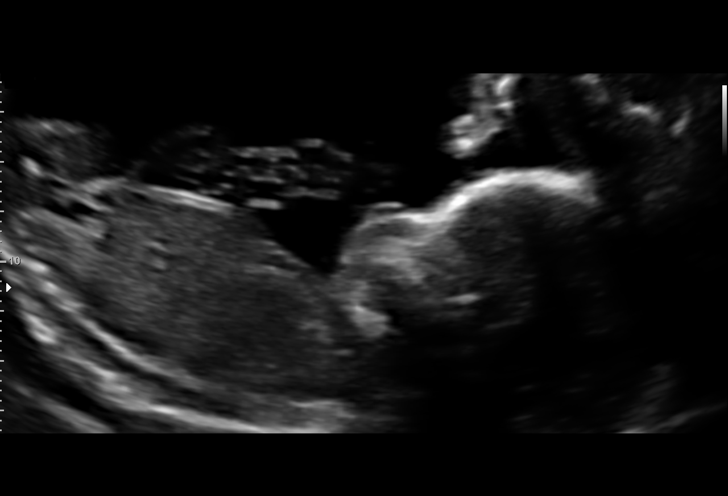
[im 9/19]
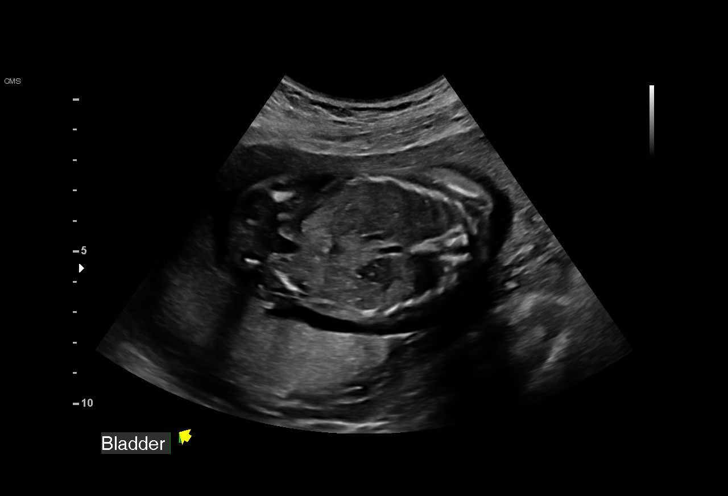
[im 10/19]
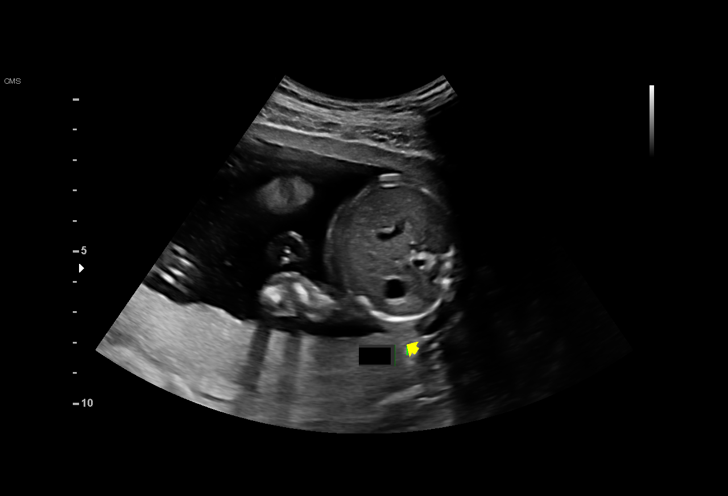
[im 12/19]
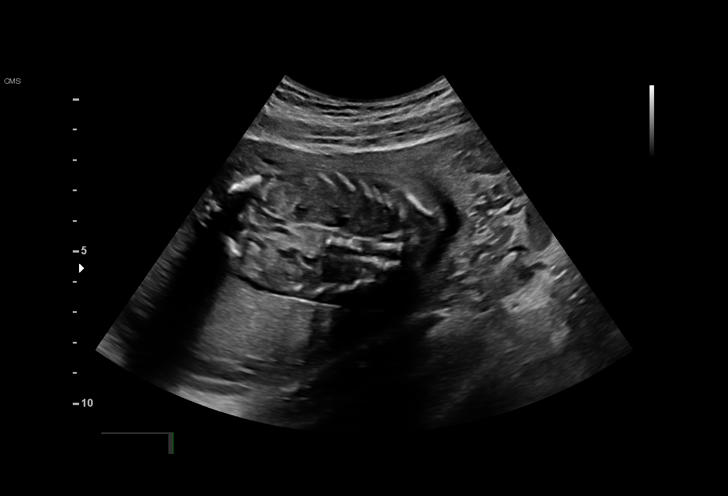
[im 13/19]
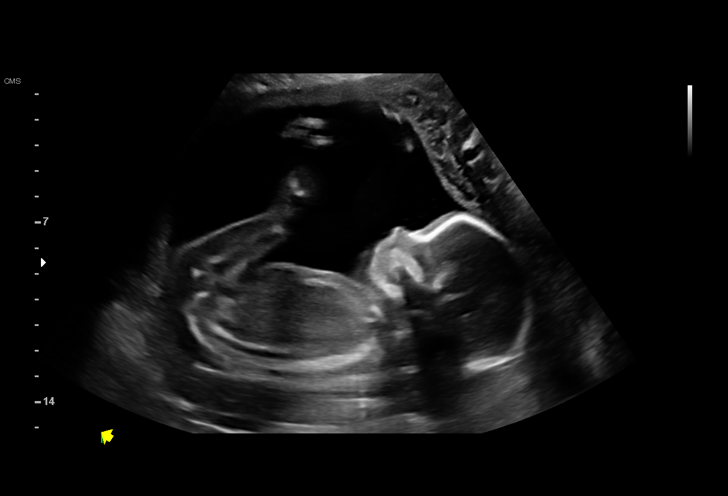
[im 14/19]
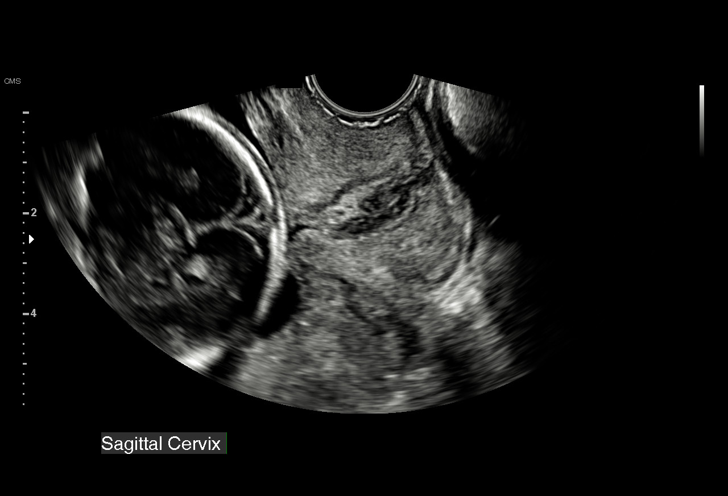
[im 16/19]
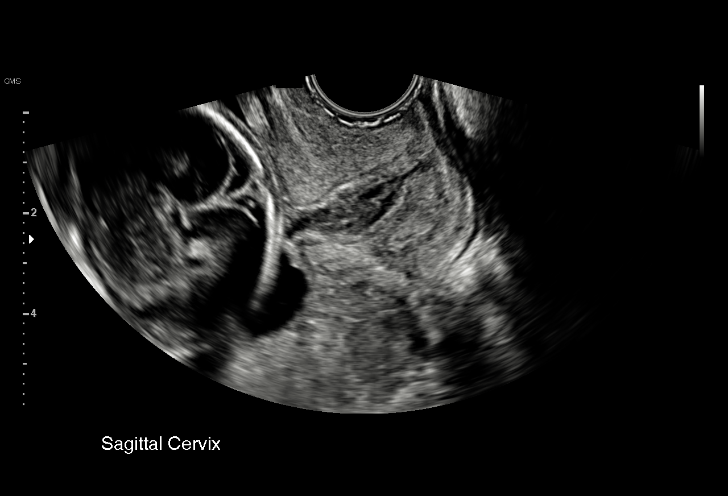
[im 17/19]
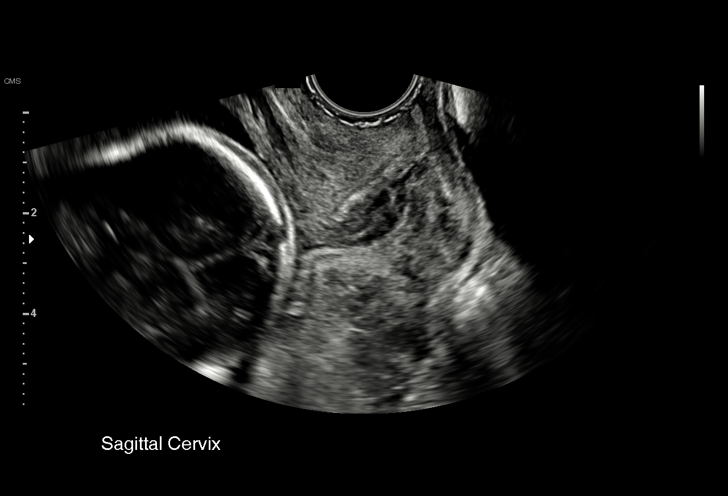
[im 19/19]
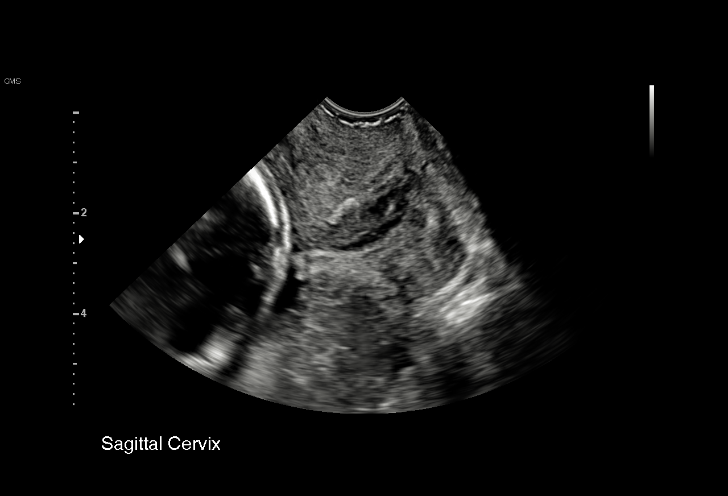

[Series 2: us mfm ob transvaginal · 2 of 3 slices shown (2 of 2)]
[im 1/3]
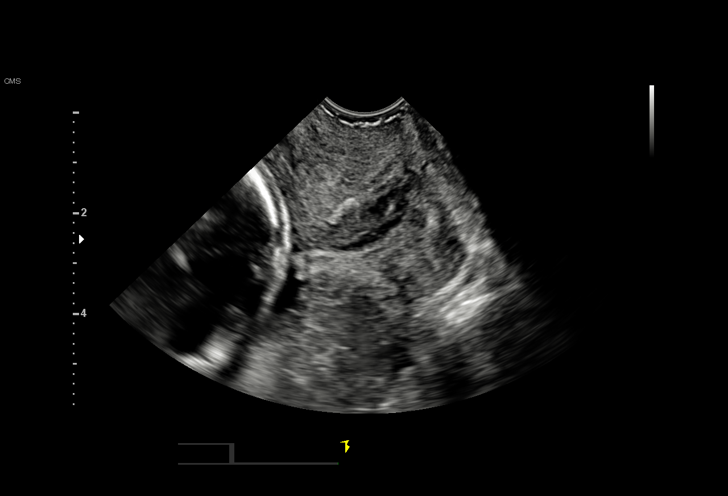
[im 3/3]
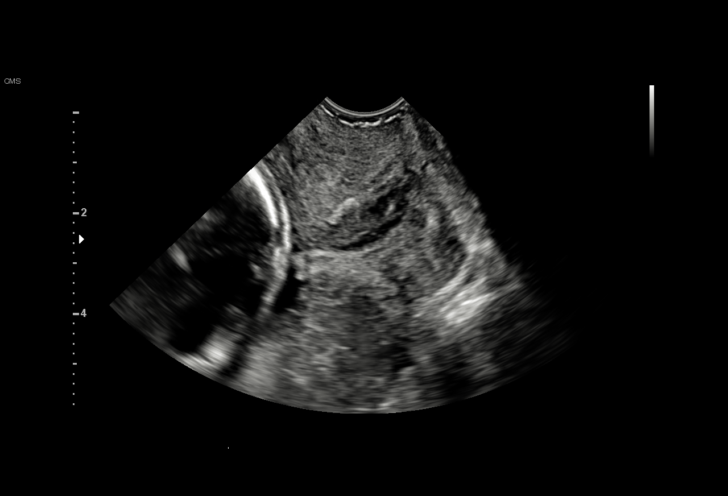

[15 of 22 positions shown; findings below may reference images not displayed]

#130

  1  US MFM OB TRANSVAGINAL               76817.2      INGUNN HARPA RONLOR
 ----------------------------------------------------------------------

 ----------------------------------------------------------------------
Indications

  Encounter for cervical length
  Pelvic pain affecting pregnancy in second
  trimester
  21 weeks gestation of pregnancy
 ----------------------------------------------------------------------
Fetal Evaluation

 Num Of Fetuses:          1
 Fetal Heart Rate(bpm):   161
 Cardiac Activity:        Observed
 Presentation:            Cephalic

 Amniotic Fluid
 AFI FV:      Within normal limits

                             Largest Pocket(cm)

OB History

 Gravidity:    1         Term:   0        Prem:   0        SAB:   0
 TOP:          0       Ectopic:  0        Living: 0
Gestational Age

 LMP:           21w 1d        Date:  08/13/18                 EDD:   05/20/19
 Best:          21w 1d     Det. By:  LMP  (08/13/18)          EDD:   05/20/19
Cervix Uterus Adnexa
 Cervix
 Length:            3.2  cm.
 Normal appearance by transvaginal scan

 Uterus
 No abnormality visualized.
Impression

 Limite exam
 Normal cervical length.
Recommendations

 Follow up as clinically indicated.

## 2020-02-10 ENCOUNTER — Encounter (HOSPITAL_COMMUNITY): Payer: Self-pay | Admitting: Emergency Medicine

## 2020-02-10 ENCOUNTER — Other Ambulatory Visit: Payer: Self-pay

## 2020-02-10 ENCOUNTER — Emergency Department (HOSPITAL_COMMUNITY): Payer: Medicaid Other

## 2020-02-10 ENCOUNTER — Emergency Department (HOSPITAL_COMMUNITY)
Admission: EM | Admit: 2020-02-10 | Discharge: 2020-02-10 | Disposition: A | Discharge status: Home / Self Care | Payer: Medicaid Other | Attending: Emergency Medicine | Admitting: Emergency Medicine

## 2020-02-10 DIAGNOSIS — Z3A00 Weeks of gestation of pregnancy not specified: Secondary | ICD-10-CM | POA: Diagnosis not present

## 2020-02-10 DIAGNOSIS — B379 Candidiasis, unspecified: Secondary | ICD-10-CM

## 2020-02-10 DIAGNOSIS — O26899 Other specified pregnancy related conditions, unspecified trimester: Secondary | ICD-10-CM | POA: Insufficient documentation

## 2020-02-10 DIAGNOSIS — B49 Unspecified mycosis: Secondary | ICD-10-CM

## 2020-02-10 DIAGNOSIS — R000 Tachycardia, unspecified: Secondary | ICD-10-CM | POA: Diagnosis not present

## 2020-02-10 DIAGNOSIS — R102 Pelvic and perineal pain: Secondary | ICD-10-CM | POA: Diagnosis not present

## 2020-02-10 DIAGNOSIS — Z3491 Encounter for supervision of normal pregnancy, unspecified, first trimester: Secondary | ICD-10-CM

## 2020-02-10 LAB — HCG, QUANTITATIVE, PREGNANCY: hCG, Beta Chain, Quant, S: 3583 m[IU]/mL — ABNORMAL HIGH (ref <5)

## 2020-02-10 LAB — CBC WITH DIFFERENTIAL/PLATELET
Abs Immature Granulocytes: 0.03 10*3/uL (ref 0.00–0.07)
Basophils Absolute: 0.1 10*3/uL (ref 0.0–0.1)
Basophils Relative: 0 %
Eosinophils Absolute: 0 10*3/uL (ref 0.0–0.5)
Eosinophils Relative: 0 %
HCT: 40.3 % (ref 36.0–46.0)
Hemoglobin: 13.4 g/dL (ref 12.0–15.0)
Immature Granulocytes: 0 %
Lymphocytes Relative: 18 %
Lymphs Abs: 2.4 10*3/uL (ref 0.7–4.0)
MCH: 24.9 pg — ABNORMAL LOW (ref 26.0–34.0)
MCHC: 33.3 g/dL (ref 30.0–36.0)
MCV: 74.9 fL — ABNORMAL LOW (ref 80.0–100.0)
Monocytes Absolute: 0.7 10*3/uL (ref 0.1–1.0)
Monocytes Relative: 5 %
Neutro Abs: 10.2 10*3/uL — ABNORMAL HIGH (ref 1.7–7.7)
Neutrophils Relative %: 77 %
Platelets: 542 10*3/uL — ABNORMAL HIGH (ref 150–400)
RBC: 5.38 MIL/uL — ABNORMAL HIGH (ref 3.87–5.11)
RDW: 16.9 % — ABNORMAL HIGH (ref 11.5–15.5)
WBC: 13.4 10*3/uL — ABNORMAL HIGH (ref 4.0–10.5)
nRBC: 0 % (ref 0.0–0.2)

## 2020-02-10 LAB — COMPREHENSIVE METABOLIC PANEL
ALT: 14 U/L (ref 0–44)
AST: 15 U/L (ref 15–41)
Albumin: 4.5 g/dL (ref 3.5–5.0)
Alkaline Phosphatase: 55 U/L (ref 38–126)
Anion gap: 9 (ref 5–15)
BUN: 14 mg/dL (ref 6–20)
CO2: 22 mmol/L (ref 22–32)
Calcium: 9.2 mg/dL (ref 8.9–10.3)
Chloride: 105 mmol/L (ref 98–111)
Creatinine, Ser: 0.71 mg/dL (ref 0.44–1.00)
GFR calc Af Amer: >60 mL/min (ref >60)
GFR calc non Af Amer: >60 mL/min (ref >60)
Glucose, Bld: 105 mg/dL — ABNORMAL HIGH (ref 70–99)
Potassium: 3.4 mmol/L — ABNORMAL LOW (ref 3.5–5.1)
Sodium: 136 mmol/L (ref 135–145)
Total Bilirubin: 1.5 mg/dL — ABNORMAL HIGH (ref 0.3–1.2)
Total Protein: 8.6 g/dL — ABNORMAL HIGH (ref 6.5–8.1)

## 2020-02-10 LAB — URINALYSIS, ROUTINE W REFLEX MICROSCOPIC
Bacteria, UA: NONE SEEN
Bilirubin Urine: NEGATIVE
Glucose, UA: NEGATIVE mg/dL
Hgb urine dipstick: NEGATIVE
Ketones, ur: 80 mg/dL — AB
Nitrite: NEGATIVE
Protein, ur: 30 mg/dL — AB
Specific Gravity, Urine: 1.031 — ABNORMAL HIGH (ref 1.005–1.030)
pH: 6 (ref 5.0–8.0)

## 2020-02-10 LAB — ABO/RH: ABO/RH(D): B POS

## 2020-02-10 MED ORDER — FLUCONAZOLE 150 MG PO TABS: 150.0000 mg | ORAL_TABLET | Freq: Every day | ORAL | 0 refills | Status: AC

## 2020-02-10 NOTE — ED Notes (Signed)
Pt refuses [place] until a provider speaks with her about her labs results. Claudia PA notified us waiting at beside.

## 2020-02-10 NOTE — ED Notes (Signed)
Pt verbalizes understanding of DC instructions. Pt belongings returned and is ambulatory out of ED.    Signature pad not working.

## 2020-02-10 NOTE — ED Provider Notes (Signed)
[facility]-EMERGENCY DEPT Provider Note   CSN: [id number] Arrival date & time: 02/10/20  1154     History Chief Complaint  Patient presents with  . Ectopic Pregnancy    possible    Dominique Travis is a 19 y.o. female G25P1011, presenting to the ED with concern for possible ectopic pregnancy. Patient was at [facility] choice today and had ultrasound which showed no intrauterine pregnancy therefore she was sent to the ED for U/S to rule out ectopic.  She endorses b/l lower abd pain with some increased normal vaginal discharge. Denies vaginal bleeding, nausea, fever. LMP 01/03/2020.  Of note she is 9 months post partum, uncomplicated SVD.  The history is provided by the patient.       Past Medical History:  Diagnosis Date  . Hx of chlamydia infection   . Medical history non-contributory     Patient Active Problem List   Diagnosis Date Noted  . SVD (spontaneous vaginal delivery) 05/01/2019  . IUGR (intrauterine growth restriction) affecting care of mother, third trimester, not applicable or unspecified fetus 04/29/2019  . Supervision of other normal pregnancy, antepartum 11/29/2018  . Chlamydia 11/29/2018    Past Surgical History:  Procedure Laterality Date  . HERNIA REPAIR       OB History    Gravida  2   Para  1   Term  1   Preterm      AB      Living  1     SAB      TAB      Ectopic      Multiple  0   Live Births  1           Family History  Problem Relation Age of Onset  . Hypertension Mother   . Diabetes Mother     Social History   Tobacco Use  . Smoking status: Never Smoker  . Smokeless tobacco: Never Used  Vaping Use  . Vaping Use: Never used  Substance Use Topics  . Alcohol use: No  . Drug use: No    Home Medications Prior to Admission medications   Medication Sig Start Date End Date Taking? Authorizing Provider  ibuprofen (ADVIL) 600 MG tablet Take 1 tablet (600 mg total) by mouth every 6 (six)  hours. Patient not taking: Reported on 02/10/2020 05/01/19   Roma Schanz, CNM  promethazine (PHENERGAN) 25 MG tablet Take 1 tablet (25 mg total) by mouth every 6 (six) hours as needed for nausea or vomiting. Patient not taking: Reported on 02/10/2020 09/06/19   Gilda Crease, MD    Allergies    Patient has no known allergies.  Review of Systems   Review of Systems  Gastrointestinal: Positive for abdominal pain.  Genitourinary: Negative for vaginal bleeding.  All other systems reviewed and are negative.   Physical Exam Updated Vital Signs BP 122/80 (BP Location: Right Arm)   Pulse (!) 104   Temp 99.1 F (37.3 C) (Oral)   Resp 16   LMP 01/03/2020   SpO2 100%   Physical Exam Vitals and nursing note reviewed.  Constitutional:      General: She is not in acute distress.    Appearance: She is well-developed. She is not ill-appearing.  HENT:     Head: Normocephalic and atraumatic.  Eyes:     Conjunctiva/sclera: Conjunctivae normal.  Cardiovascular:     Rate and Rhythm: Normal rate and regular rhythm.  Pulmonary:     Effort:  Pulmonary effort is normal. No respiratory distress.     Breath sounds: Normal breath sounds.  Abdominal:     General: Bowel sounds are normal.     Palpations: Abdomen is soft.     Tenderness: There is abdominal tenderness (suprapubic). There is no guarding or rebound.  Genitourinary:    Comments: Pt refused pelvic exam Skin:    General: Skin is warm.  Neurological:     Mental Status: She is alert.  Psychiatric:        Behavior: Behavior normal.     ED Results / Procedures / Treatments   Labs (all labs ordered are listed, but only abnormal results are displayed) Labs Reviewed  CBC WITH DIFFERENTIAL/PLATELET - Abnormal; Notable for the following components:      Result Value   WBC 13.4 (*)    RBC 5.38 (*)    MCV 74.9 (*)    MCH 24.9 (*)    RDW 16.9 (*)    Platelets 542 (*)    Neutro Abs 10.2 (*)    All other components within  normal limits  COMPREHENSIVE METABOLIC PANEL - Abnormal; Notable for the following components:   Potassium 3.4 (*)    Glucose, Bld 105 (*)    Total Protein 8.6 (*)    Total Bilirubin 1.5 (*)    All other components within normal limits  WET PREP, GENITAL  HCG, QUANTITATIVE, PREGNANCY  URINALYSIS, ROUTINE W REFLEX MICROSCOPIC  ABO/RH  GC/CHLAMYDIA PROBE AMP ([place]) NOT AT [facility]    EKG None  Radiology No results found.  Procedures Procedures (including critical care time)  Medications Ordered in ED Medications - No data to display  ED Course  I have reviewed the triage vital signs and the nursing notes.  Pertinent labs & imaging results that were available during my care of the patient were reviewed by me and considered in my medical decision making (see chart for details).    MDM Rules/Calculators/A&P                          Patient presenting from outpatient clinic with concern for ectopic pregnancy.  She received ultrasound today and no intrauterine pregnancy was visualized.  She is having some suprapubic abdominal discomfort though no vaginal bleeding.  LMP 01/03/2020.  Patient refused pelvic exam.  Abdomen is tender though no guarding or rebound.  She is slightly tachycardic.  Labs with leukocytosis of 13.4, hemoglobin 13.4.  Rh+.  hCG quantitative and pelvic ultrasound pending at shift change.  Care assumed by PA Gibbons to follow ultrasound and determine appropriate disposition.  Final Clinical Impression(s) / ED Diagnoses Final diagnoses:  None    Rx / DC Orders ED Discharge Orders    None       Blanton Kardell, [place] N, PA-C 02/10/20 1521    Pollyann Savoy, MD 02/10/20 1537

## 2020-02-10 NOTE — ED Notes (Signed)
PT verbally declined pelvic exam with provider and EMT in room due to she stated it hurt the last time, PT was recommended by provider to have exam, PT declined.

## 2020-02-10 NOTE — ED Notes (Signed)
PT refusing blood work right now, due to pt saying she haven't ate anything and she will faint if she gets her blood taken. Writer informed her it's best to get blood-work out of the way pt still refused.

## 2020-02-10 NOTE — ED Triage Notes (Signed)
Patient was sent over from [facility] choice for possible ectopic pregnancy Last period 01/03/2020

## 2020-02-10 NOTE — Discharge Instructions (Addendum)
You were seen in the ER for pelvic pain  You declined pelvic exam, STD testing and ultrasound  Your HCG is 3,583 today. Based on your last period you are approximately 5 weeks and 3 days pregnant  Urinalysis did not show any bacteria but it showed yeast.  This can explain the discharge you reported.  Take fluconazole  Go to OB for repeat HCG testing in 72 hours to make sure HCG levels continue to increase appropriately.  You will need a first ultrasound at around 8 weeks.  Return for sudden severe pelvic pain, vaginal bleeding or leaking, fevers, urinary symptoms

## 2020-02-10 NOTE — ED Provider Notes (Signed)
  Physical Exam  BP 101/68   Pulse 75   Temp 99.1 F (37.3 C) (Oral)   Resp 18   LMP 01/03/2020   SpO2 99%   ED Course/Procedures   Clinical Course as of Feb 09 1814  Tue Feb 10, 2020  1634 Patient handed off to me by previous EDPA.  See note for full details.    Briefly patient here with suprapubic abdominal pain.  Currently early pregnancy LMP June 26 approximately 5 w 3 d.  No vaginal bleeding.   She declined pelvic exam by previous EDPA.   Plan to fu on [place] to confirm IUP if possible    [CG]  1811 Budding Yeast: PRESENT [CG]    Clinical Course User Index [CG] Liberty Handy, PA-C    Procedures  MDM   1740: Patient declined pelvic exam from previous EDPA.  RN notified me patient refusing [place] until her labs are discussed with her.   I discussed labs with patient including HCG levels.  States she doesn't think this is an ectopic pregnancy. Also states she didn't know she was so early and less than 6 weeks.  States ultrasound probably wont visualize anything and declines ultrasounds.  Actually states she is here for a cyst in her ovary and wants to know what can be done about it. Was told it was "very big".  Also wants to know if we tested her for STDs.   Explained to patient Korea could visualize ovarian cyst and possibly gestational sac in uterus but patient declined.  Explained cyst treatment by OBGYN, no emergent treatments here.  Patient declines Korea but wants to wait for UA to make sure "I don't have an infection". She however denies any fever, urinary symptoms.   Will dc patient with OBGYN follow up for repeat HCG and [place] to confirm normal progression of pregnancy.  Patient agrees with this plan.  Has OB established.   1816: [place] without bacteria but shows budding yeast. Patient does report vaginal discharge. Will treat.      Liberty Handy, PA-C 02/10/20 1817    Rolan Bucco, MD 02/10/20 [phone]

## 2021-03-17 DIAGNOSIS — H5213 Myopia, bilateral: Secondary | ICD-10-CM | POA: Diagnosis not present

## 2022-11-08 ENCOUNTER — Ambulatory Visit (HOSPITAL_COMMUNITY)
Admission: RE | Admit: 2022-11-08 | Discharge: 2022-11-08 | Disposition: A | Discharge status: Home / Self Care | Payer: 59 | Source: Ambulatory Visit | Attending: Emergency Medicine | Admitting: Emergency Medicine

## 2022-11-08 ENCOUNTER — Encounter (HOSPITAL_COMMUNITY): Payer: Self-pay

## 2022-11-08 VITALS — BP 116/81 | HR 78 | Temp 98.7°F | Resp 16 | Ht 62.0 in

## 2022-11-08 DIAGNOSIS — K047 Periapical abscess without sinus: Secondary | ICD-10-CM

## 2022-11-08 MED ORDER — AMOXICILLIN-POT CLAVULANATE 875-125 MG PO TABS: 1.0000 | ORAL_TABLET | Freq: Two times a day (BID) | ORAL | 0 refills | Status: AC

## 2022-11-08 NOTE — ED Triage Notes (Signed)
Patient here today with c/o left upper dental pain. She stated that last year in February, she had an infection when she was about to get her tooth pulled. They did not pull it due to the infection. They gave an antibiotic. Patient completed the medication but thinks that the infection never went away. She is planning on going to see another dentist for her tooth extraction but wanted to make sure that the infections is gone. She only feels pain whenever she eats something sweet.

## 2022-11-08 NOTE — Discharge Instructions (Addendum)
I am covering you for a dental infection today with Augmentin.  Please take all antibiotics as prescribed until finished, you can take them with food to prevent gastrointestinal upset.  It is imperative that you follow-up with a dentist for further evaluation and potential tooth removal.  I have attached some resources below as well as on the back of your discharge papers.  Please seek immediate care if you develop oral swelling, trouble swallowing, high fevers, or any new concerning symptoms.  [facility] [place] Address: [address] [place], [place], [place] [address] Phone: [phone]  [facility] [phone] extension [phone] [address]  Dr. Lawrence Marseilles [phone] [address]  [place] [phone] [facility].  [facility] [phone] extension [phone], [place], [place], [address] First come first serve for the first 10 clients.  May do simple extractions only, no wisdom teeth or surgery.  You may try the second for Thursday of the month starting at 6:30 AM.  [facility] You may call the school to see if they are still helping to provide dental care for emergent cases.

## 2022-11-08 NOTE — ED Provider Notes (Signed)
[facility]    CSN: [id number] Arrival date & time: 11/08/22  1256      History   Chief Complaint Chief Complaint  Patient presents with   Dental Problem    Need antibiotic for tooth infection - Entered by patient    HPI Dominique Travis is a 22 y.o. female.   Reports she does need a tooth pulled, one of her upper left molars.  Last time she had this issue she tried to go to the dentist, however, they said she needed to be placed on antibiotics and then she can get her teeth pulled.  She is presenting to clinic today requesting antibiotics prior to finding a dentist/getting her tooth removed.  She denies any oral swelling, fevers or pain.   The history is provided by the patient and medical records.    Past Medical History:  Diagnosis Date   Hx of chlamydia infection    Medical history non-contributory     Patient Active Problem List   Diagnosis Date Noted   SVD (spontaneous vaginal delivery) 05/01/2019   IUGR (intrauterine growth restriction) affecting care of mother, third trimester, not applicable or unspecified fetus 04/29/2019   Supervision of other normal pregnancy, antepartum 11/29/2018   Chlamydia 11/29/2018    Past Surgical History:  Procedure Laterality Date   HERNIA REPAIR      OB History     Gravida  2   Para  1   Term  1   Preterm      AB      Living  1      SAB      IAB      Ectopic      Multiple  0   Live Births  1            Home Medications    Prior to Admission medications   Medication Sig Start Date End Date Taking? Authorizing Provider  amoxicillin-clavulanate (AUGMENTIN) 875-125 MG tablet Take 1 tablet by mouth every 12 (twelve) hours. 11/08/22  Yes Mali Eppard, [place], FNP    Family History Family History  Problem Relation Age of Onset   Hypertension Mother    Diabetes Mother     Social History Social History   Tobacco Use   Smoking status: Never   Smokeless tobacco: Never  Vaping Use    Vaping Use: Never used  Substance Use Topics   Alcohol use: No   Drug use: Yes    Types: Marijuana     Allergies   Patient has no known allergies.   Review of Systems Review of Systems  Constitutional:  Negative for fever.  HENT:  Positive for dental problem. Negative for drooling, mouth sores and sore throat.      Physical Exam Triage Vital Signs ED Triage Vitals  Enc Vitals Group     BP 11/08/22 1332 116/81     Pulse Rate 11/08/22 1332 78     Resp 11/08/22 1332 16     Temp 11/08/22 1332 98.7 F (37.1 C)     Temp Source 11/08/22 1332 Oral     SpO2 11/08/22 1332 98 %     Weight --      Height 11/08/22 1331 5\' 2"  (1.575 m)     Head Circumference --      Peak Flow --      Pain Score 11/08/22 1331 0     Pain Loc --      Pain Edu? --  Excl. in GC? --    No data found.  Updated Vital Signs BP 116/81 (BP Location: Left Arm)   Pulse 78   Temp 98.7 F (37.1 C) (Oral)   Resp 16   Ht 5\' 2"  (1.575 m)   LMP 11/05/2022 (Exact Date)   SpO2 98%   Breastfeeding No   BMI 23.96 kg/m   Visual Acuity Right Eye Distance:   Left Eye Distance:   Bilateral Distance:    Right Eye Near:   Left Eye Near:    Bilateral Near:     Physical Exam Vitals and nursing note reviewed.  Constitutional:      Appearance: Normal appearance.  HENT:     Head: Normocephalic and atraumatic.     Right Ear: External ear normal.     Left Ear: External ear normal.     Nose: Nose normal.     Mouth/Throat:     Lips: Pink.     Mouth: Mucous membranes are moist.     Dentition: Abnormal dentition. Dental caries present. No gingival swelling or dental abscesses.     Tongue: No lesions.     Palate: No mass.     Pharynx: Oropharynx is clear. Uvula midline. No pharyngeal swelling or oropharyngeal exudate.      Comments: Reports needing a left upper molar pulled, sometimes the area has an ulcer around it.  Denies any pain, swelling or drainage. Eyes:     General: No scleral icterus.     Conjunctiva/sclera: Conjunctivae normal.  Cardiovascular:     Rate and Rhythm: Normal rate and regular rhythm.  Pulmonary:     Effort: Pulmonary effort is normal. No respiratory distress.  Musculoskeletal:     Cervical back: Normal range of motion.  Neurological:     General: No focal deficit present.     Mental Status: She is alert and oriented to person, place, and time.  Psychiatric:        Mood and Affect: Mood normal.        Behavior: Behavior normal. Behavior is cooperative.      UC Treatments / Results  Labs (all labs ordered are listed, but only abnormal results are displayed) Labs Reviewed - No data to display  EKG   Radiology No results found.  Procedures Procedures (including critical care time)  Medications Ordered in UC Medications - No data to display  Initial Impression / Assessment and Plan / UC Course  I have reviewed the triage vital signs and the nursing notes.  Pertinent labs & imaging results that were available during my care of the patient were reviewed by me and considered in my medical decision making (see chart for details).  Vitals and triage reviewed, patient is hemodynamically stable.  Presents to clinic requesting antibiotics prior to potential tooth removal.  Does have a known tooth that needs removal.  Will cover with Augmentin, encouraged to follow-up with dentistry and provided resources.  Patient verbalized understanding, no questions at this time.     Final Clinical Impressions(s) / UC Diagnoses   Final diagnoses:  Dental infection     Discharge Instructions      I am covering you for a dental infection today with Augmentin.  Please take all antibiotics as prescribed until finished, you can take them with food to prevent gastrointestinal upset.  It is imperative that you follow-up with a dentist for further evaluation and potential tooth removal.  I have attached some resources below as well as on the back  of your discharge  papers.  Please seek immediate care if you develop oral swelling, trouble swallowing, high fevers, or any new concerning symptoms.  [facility] [place], [place] Address: [address] [place], [place], [place] [address] Phone: [phone]  [facility] [phone] extension [phone] [address]  Dr. Lawrence Marseilles [phone] [address]  [place] [phone] [address] [facility].  [facility] [phone] extension [phone] [place], [place], [address] First come first serve for the first 10 clients.  May do simple extractions only, no wisdom teeth or surgery.  You may try the second for Thursday of the month starting at 6:30 AM.  [facility] You may call the school to see if they are still helping to provide dental care for emergent cases.      ED Prescriptions     Medication Sig Dispense Auth. Provider   amoxicillin-clavulanate (AUGMENTIN) 875-125 MG tablet Take 1 tablet by mouth every 12 (twelve) hours. 14 tablet Chrles Selley, [place] N, [place]      PDMP not reviewed this encounter.   Branson Kranz, [place] N, [place] 11/08/22 1349
